# Patient Record
Sex: Male | Born: 1940 | Race: White | Hispanic: No | Marital: Married | Smoking: Former smoker
Health system: Southern US, Community
[De-identification: ages and names within clinical notes are randomized; demographics above are authoritative.]

## PROBLEM LIST (undated history)

## (undated) DIAGNOSIS — M8580 Other specified disorders of bone density and structure, unspecified site: Secondary | ICD-10-CM

## (undated) DIAGNOSIS — K5790 Diverticulosis of intestine, part unspecified, without perforation or abscess without bleeding: Secondary | ICD-10-CM

## (undated) DIAGNOSIS — C801 Malignant (primary) neoplasm, unspecified: Secondary | ICD-10-CM

## (undated) DIAGNOSIS — M1990 Unspecified osteoarthritis, unspecified site: Secondary | ICD-10-CM

## (undated) DIAGNOSIS — K635 Polyp of colon: Secondary | ICD-10-CM

## (undated) DIAGNOSIS — T7840XA Allergy, unspecified, initial encounter: Secondary | ICD-10-CM

## (undated) DIAGNOSIS — E785 Hyperlipidemia, unspecified: Secondary | ICD-10-CM

## (undated) HISTORY — PX: KNEE SURGERY: SHX244

## (undated) HISTORY — DX: Diverticulosis of intestine, part unspecified, without perforation or abscess without bleeding: K57.90

## (undated) HISTORY — DX: Other specified disorders of bone density and structure, unspecified site: M85.80

## (undated) HISTORY — DX: Hyperlipidemia, unspecified: E78.5

## (undated) HISTORY — DX: Unspecified osteoarthritis, unspecified site: M19.90

## (undated) HISTORY — DX: Polyp of colon: K63.5

## (undated) HISTORY — PX: COLONOSCOPY: SHX174

## (undated) HISTORY — PX: TONSILLECTOMY: SUR1361

## (undated) HISTORY — DX: Malignant (primary) neoplasm, unspecified: C80.1

## (undated) HISTORY — DX: Allergy, unspecified, initial encounter: T78.40XA

---

## 2006-08-03 ENCOUNTER — Ambulatory Visit: Payer: Self-pay | Admitting: Internal Medicine

## 2006-08-15 ENCOUNTER — Ambulatory Visit: Payer: Self-pay | Admitting: Internal Medicine

## 2006-08-15 ENCOUNTER — Encounter: Payer: Self-pay | Admitting: Internal Medicine

## 2010-09-02 ENCOUNTER — Encounter: Payer: Self-pay | Admitting: Genetic Counselor

## 2010-09-21 ENCOUNTER — Encounter: Payer: Medicare Other | Admitting: Genetic Counselor

## 2011-07-09 ENCOUNTER — Encounter: Payer: Self-pay | Admitting: Internal Medicine

## 2011-08-04 ENCOUNTER — Encounter: Payer: Self-pay | Admitting: Internal Medicine

## 2011-09-06 ENCOUNTER — Ambulatory Visit (INDEPENDENT_AMBULATORY_CARE_PROVIDER_SITE_OTHER): Payer: Medicare Other | Admitting: Internal Medicine

## 2011-09-06 ENCOUNTER — Encounter: Payer: Self-pay | Admitting: Internal Medicine

## 2011-09-06 VITALS — BP 148/84 | HR 72 | Ht 70.0 in | Wt 196.6 lb

## 2011-09-06 DIAGNOSIS — R198 Other specified symptoms and signs involving the digestive system and abdomen: Secondary | ICD-10-CM

## 2011-09-06 DIAGNOSIS — K648 Other hemorrhoids: Secondary | ICD-10-CM

## 2011-09-06 DIAGNOSIS — Z86010 Personal history of colonic polyps: Secondary | ICD-10-CM

## 2011-09-06 MED ORDER — MOVIPREP 100 G PO SOLR: 1.0000 | Freq: Once | ORAL | Status: DC

## 2011-09-06 MED ORDER — HYDROCORTISONE 2.5 % RE CREA: TOPICAL_CREAM | Freq: Every evening | RECTAL | Status: AC | PRN

## 2011-09-06 NOTE — Progress Notes (Signed)
HISTORY OF PRESENT ILLNESS:  Cory Barker is a 71 y.o. male with the below listed medical history who presents today regarding symptomatic hemorrhoids and surveillance colonoscopy. The patient underwent routine screening colonoscopy in June of 2008. At that time there were complaints of "pelvic pain and rectal prolapse". Examination revealed left-sided diverticulosis, internal hemorrhoids, and diminutive colon polyps which were removed and found to be adenomatous. Followup in 5 years recommended. Patient tells me that he has changed his diet. He more fruits and vegetables. He has noticed that his bowel habits intended to be a bit more loose, except for when consuming cheese. He reports intermittent problems with prolapsing hemorrhoids. Occasional fecal soilage. Possibly minor bleeding. He states the symptoms have been present for 10-11 years and began after a physician performed a standard digital rectal examination and had difficulty removing his finger (according to the patient). In any event, outside records from his physician reviewed. Recent rectal examination revealed no abnormalities. I have also reviewed outside blood work which was unremarkable and included urinalysis and comprehensive metabolic panel. Also, unremarkable EKG. Patient denies abdominal pain or weight loss. No interval family history of colon cancer.  REVIEW OF SYSTEMS:  All non-GI ROS negative except for sinus and allergy trouble, arthritis  Past Medical History  Diagnosis Date  . Diverticulosis   . Colon polyps   . Hemorrhoids   . Arthritis   . Osteopenia   . Hyperlipidemia     Past Surgical History  Procedure Date  . Knee surgery     bilateral  . Colonoscopy   . Tonsillectomy     Social History Cory Barker  reports that he quit smoking about 28 years ago. He has never used smokeless tobacco. He reports that he drinks alcohol. He reports that he does not use illicit drugs.  family history includes Lung cancer (age  of onset:53) in his father and Stroke (age of onset:89) in his mother.  Allergies  Allergen Reactions  . Penicillins Rash       PHYSICAL EXAMINATION: Vital signs: BP 148/84  Pulse 72  Ht 5\' 10"  (1.778 m)  Wt 196 lb 9.6 oz (89.177 kg)  BMI 28.21 kg/m2  Constitutional: generally well-appearing, no acute distress Psychiatric: alert and oriented x3, cooperative Eyes: extraocular movements intact, anicteric, conjunctiva pink Mouth: oral pharynx moist, no lesions Neck: supple no lymphadenopathy Cardiovascular: heart regular rate and rhythm, no murmur Lungs: clear to auscultation bilaterally Abdomen: soft, nontender, nondistended, no obvious ascites, no peritoneal signs, normal bowel sounds, no organomegaly Rectal:no external abnormalities. Palpable internal hemorrhoids. No pain. Hemoccult negative. Extremities: no lower extremity edema bilaterally Skin: no lesions on visible extremities Neuro: No focal deficits. No asterixis.     ASSESSMENT:  #1. Internal hemorrhoids #2. Nonspecific change in bowel habits #3. Personal history of adenomatous polyps. Due for followup #4. History of diverticulosis   PLAN:  #1. Fiber supplementation with Metamucil to assist with change in bowel habits and hemorrhoids #2. Prescribe Anusol suppositories to be taken at night as needed #3. Discussion today on the pathophysiology and treatment of hemorrhoid disease. 15 minutes. #4. Schedule surveillance colonoscopy.The nature of the procedure, as well as the risks, benefits, and alternatives were carefully and thoroughly reviewed with the patient. Ample time for discussion and questions allowed. The patient understood, was satisfied, and agreed to proceed.  #5. Movi prep prescribed. The patient instructed on its use

## 2011-09-06 NOTE — Patient Instructions (Addendum)
You have been scheduled for a colonoscopy with propofol. Please follow written instructions given to you at your visit today.  Please pick up your prep kit at the pharmacy within the next 1-3 days.  We have sent the following medications to your pharmacy for you to pick up at your convenience: Anusol

## 2011-09-29 ENCOUNTER — Encounter: Payer: Self-pay | Admitting: Internal Medicine

## 2011-09-29 ENCOUNTER — Ambulatory Visit (AMBULATORY_SURGERY_CENTER): Payer: Medicare Other | Admitting: Internal Medicine

## 2011-09-29 VITALS — BP 147/72 | HR 55 | Temp 96.3°F | Resp 20 | Ht 70.0 in | Wt 196.0 lb

## 2011-09-29 DIAGNOSIS — R198 Other specified symptoms and signs involving the digestive system and abdomen: Secondary | ICD-10-CM

## 2011-09-29 DIAGNOSIS — D126 Benign neoplasm of colon, unspecified: Secondary | ICD-10-CM

## 2011-09-29 DIAGNOSIS — K648 Other hemorrhoids: Secondary | ICD-10-CM

## 2011-09-29 DIAGNOSIS — Z86010 Personal history of colonic polyps: Secondary | ICD-10-CM

## 2011-09-29 DIAGNOSIS — Z1211 Encounter for screening for malignant neoplasm of colon: Secondary | ICD-10-CM

## 2011-09-29 MED ORDER — SODIUM CHLORIDE 0.9 % IV SOLN: 500.0000 mL | INTRAVENOUS | Status: DC

## 2011-09-29 NOTE — Patient Instructions (Addendum)
YOU HAD AN ENDOSCOPIC PROCEDURE TODAY AT THE [facility]: Refer to the procedure report that was given to you for any specific questions about what was found during the examination.  If the procedure report does not answer your questions, please call your gastroenterologist to clarify.  If you requested that your care partner not be given the details of your procedure findings, then the procedure report has been included in a sealed envelope for you to review at your convenience later.  YOU SHOULD EXPECT: Some feelings of bloating in the abdomen. Passage of more gas than usual.  Walking can help get rid of the air that was put into your GI tract during the procedure and reduce the bloating. If you had a lower endoscopy (such as a colonoscopy or flexible sigmoidoscopy) you may notice spotting of blood in your stool or on the toilet paper. If you underwent a bowel prep for your procedure, then you may not have a normal bowel movement for a few days.  DIET: Your first meal following the procedure should be a light meal and then it is ok to progress to your normal diet.  A half-sandwich or bowl of soup is an example of a good first meal.  Heavy or fried foods are harder to digest and may make you feel nauseous or bloated.  Likewise meals heavy in dairy and vegetables can cause extra gas to form and this can also increase the bloating.  Drink plenty of fluids but you should avoid alcoholic beverages for 24 hours.  ACTIVITY: Your care partner should take you home directly after the procedure.  You should plan to take it easy, moving slowly for the rest of the day.  You can resume normal activity the day after the procedure however you should NOT DRIVE or use heavy machinery for 24 hours (because of the sedation medicines used during the test).    SYMPTOMS TO REPORT IMMEDIATELY: A gastroenterologist can be reached at any hour.  During normal business hours, 8:30 AM to 5:00 PM Monday through Friday,  call [phone].  After hours and on weekends, please call the GI answering service at [phone] who will take a message and have the physician on call contact you.   Following lower endoscopy (colonoscopy or flexible sigmoidoscopy):  Excessive amounts of blood in the stool  Significant tenderness or worsening of abdominal pains  Swelling of the abdomen that is new, acute  Fever of 100F or higher  FOLLOW UP: If any biopsies were taken you will be contacted by phone or by letter within the next 1-3 weeks.  Call your gastroenterologist if you have not heard about the biopsies in 3 weeks.  Our staff will call the home number listed on your records the next business day following your procedure to check on you and address any questions or concerns that you may have at that time regarding the information given to you following your procedure. This is a courtesy call and so if there is no answer at the home number and we have not heard from you through the emergency physician on call, we will assume that you have returned to your regular daily activities without incident.  SIGNATURES/CONFIDENTIALITY: You and/or your care partner have signed paperwork which will be entered into your electronic medical record.  These signatures attest to the fact that that the information above on your After Visit Summary has been reviewed and is understood.  Full responsibility of the confidentiality of this   discharge information lies with you and/or your care-partner.   Ok to resume your normal medications 

## 2011-09-29 NOTE — Progress Notes (Signed)
Propofol per k rogers crna. All medicines titrated per crna throughout the entire procedure. See scanned intra procedure report. ewm

## 2011-09-29 NOTE — Op Note (Signed)
[facility] [address] [place], [place]  [address]  COLONOSCOPY PROCEDURE REPORT  PATIENT:  Cory Barker, Cory Barker  MR#:  [id number] BIRTHDATE:  1940-09-14, 71 yrs. old  GENDER:  male ENDOSCOPIST:  Wilhemina Bonito. Eda Keys, MD REF. BY:  Surveillance Program Recall, PROCEDURE DATE:  09/29/2011 PROCEDURE:  Colonoscopy with snare polypectomy x 4 ASA CLASS:  Class II INDICATIONS:  history of pre-cancerous (adenomatous) colon polyps, surveillance and high-risk screening ; INDEX EXAM 08-2006 W/ TAs MEDICATIONS:   MAC sedation, administered by CRNA, propofol (Diprivan) 280 mg IV  DESCRIPTION OF PROCEDURE:   After the risks benefits and alternatives of the procedure were thoroughly explained, informed consent was obtained.  Digital rectal exam was performed and revealed no abnormalities.   The LB [id number] endoscope was introduced through the anus and advanced to the cecum, which was identified by both the appendix and ileocecal valve, without limitations.  The quality of the prep was excellent, using MoviPrep.  The instrument was then slowly withdrawn as the colon was fully examined. <<PROCEDUREIMAGES>>  FINDINGS:  Four polyps, measuring 4-22mm, were found in the ascending (2) and transverse (2) colon. Polyps were snared without cautery. Retrieval was successful.  Moderate diverticulosis was found in the left colon. Retroflexed views in the rectum revealed internal hemorrhoids. The time to cecum = 4:19  minutes. The scope was then withdrawn in 12:34 minutes from the cecum and the procedure completed.  COMPLICATIONS:  None  ENDOSCOPIC IMPRESSION: 1) Four polyps in the  colon - removed 2) Moderate diverticulosis in the left colon 3) Internal hemorrhoids  RECOMMENDATIONS: 1) Repeat Colonoscopy in 3 years.  ______________________________ Wilhemina Bonito. Eda Keys, MD  CC:  Herb Grays, MD; The Patient  n. eSIGNED:   Wilhemina Bonito. Eda Keys at 09/29/2011 04:54 PM  Cory Barker, Cory Maduro, Cory Barker

## 2011-09-29 NOTE — Progress Notes (Addendum)
Abdomen distended and not able to pass air.  Levsin 0.169m SL 2 tablets given.  Knees to chest and told to bear down and try to pass air.  Pt was turned to right and left side; he was able to pass air.  Abdomen still distended, so helped up to the BR to try to pass air  Large amt or air passed and abdomen soft  Patient did not experience any of the following events: a burn prior to discharge; a fall within the facility; wrong site/side/patient/procedure/implant event; or a hospital transfer or hospital admission upon discharge from the facility. [phone]) Patient did not have preoperative order for IV antibiotic SSI prophylaxis. [phone])

## 2011-09-30 ENCOUNTER — Telehealth: Payer: Self-pay

## 2011-09-30 NOTE — Telephone Encounter (Signed)
  Follow up Call-  Call back number 09/29/2011  Post procedure Call Back phone  # [phone]  Permission to leave phone message Yes     Patient questions:  Do you have a fever, pain , or abdominal swelling? no Pain Score  0 *  Have you tolerated food without any problems? yes  Have you been able to return to your normal activities? yes  Do you have any questions about your discharge instructions: Diet   no Medications  no Follow up visit  no  Do you have questions or concerns about your Care? no  Actions: * If pain score is 4 or above: No action needed, pain <4.

## 2011-10-06 ENCOUNTER — Encounter: Payer: Self-pay | Admitting: Internal Medicine

## 2013-08-13 ENCOUNTER — Other Ambulatory Visit: Payer: Self-pay | Admitting: Orthopedic Surgery

## 2013-08-13 DIAGNOSIS — M47812 Spondylosis without myelopathy or radiculopathy, cervical region: Secondary | ICD-10-CM

## 2013-08-14 ENCOUNTER — Other Ambulatory Visit: Payer: Medicare Other

## 2013-08-15 ENCOUNTER — Other Ambulatory Visit: Payer: Self-pay | Admitting: Orthopedic Surgery

## 2013-08-15 ENCOUNTER — Ambulatory Visit
Admission: RE | Admit: 2013-08-15 | Discharge: 2013-08-15 | Disposition: A | Discharge status: Home / Self Care | Payer: Medicare Other | Source: Ambulatory Visit | Attending: Orthopedic Surgery | Admitting: Orthopedic Surgery

## 2013-08-15 DIAGNOSIS — M47812 Spondylosis without myelopathy or radiculopathy, cervical region: Secondary | ICD-10-CM

## 2013-08-15 DIAGNOSIS — Z77018 Contact with and (suspected) exposure to other hazardous metals: Secondary | ICD-10-CM

## 2014-04-03 DIAGNOSIS — L814 Other melanin hyperpigmentation: Secondary | ICD-10-CM | POA: Diagnosis not present

## 2014-04-03 DIAGNOSIS — D2339 Other benign neoplasm of skin of other parts of face: Secondary | ICD-10-CM | POA: Diagnosis not present

## 2014-04-03 DIAGNOSIS — Z85828 Personal history of other malignant neoplasm of skin: Secondary | ICD-10-CM | POA: Diagnosis not present

## 2014-04-03 DIAGNOSIS — D2239 Melanocytic nevi of other parts of face: Secondary | ICD-10-CM | POA: Diagnosis not present

## 2014-04-03 DIAGNOSIS — L821 Other seborrheic keratosis: Secondary | ICD-10-CM | POA: Diagnosis not present

## 2014-04-03 DIAGNOSIS — Z08 Encounter for follow-up examination after completed treatment for malignant neoplasm: Secondary | ICD-10-CM | POA: Diagnosis not present

## 2014-04-03 DIAGNOSIS — L570 Actinic keratosis: Secondary | ICD-10-CM | POA: Diagnosis not present

## 2014-04-03 DIAGNOSIS — D225 Melanocytic nevi of trunk: Secondary | ICD-10-CM | POA: Diagnosis not present

## 2014-05-01 DIAGNOSIS — Z09 Encounter for follow-up examination after completed treatment for conditions other than malignant neoplasm: Secondary | ICD-10-CM | POA: Diagnosis not present

## 2014-05-01 DIAGNOSIS — Z872 Personal history of diseases of the skin and subcutaneous tissue: Secondary | ICD-10-CM | POA: Diagnosis not present

## 2014-07-19 DIAGNOSIS — M542 Cervicalgia: Secondary | ICD-10-CM | POA: Diagnosis not present

## 2014-07-19 DIAGNOSIS — M5032 Other cervical disc degeneration, mid-cervical region: Secondary | ICD-10-CM | POA: Diagnosis not present

## 2014-07-19 DIAGNOSIS — M5412 Radiculopathy, cervical region: Secondary | ICD-10-CM | POA: Diagnosis not present

## 2014-07-25 ENCOUNTER — Encounter: Payer: Self-pay | Admitting: Internal Medicine

## 2014-08-15 ENCOUNTER — Encounter: Payer: Self-pay | Admitting: Internal Medicine

## 2014-08-15 DIAGNOSIS — Z85828 Personal history of other malignant neoplasm of skin: Secondary | ICD-10-CM | POA: Diagnosis not present

## 2014-08-15 DIAGNOSIS — L648 Other androgenic alopecia: Secondary | ICD-10-CM | POA: Diagnosis not present

## 2014-08-15 DIAGNOSIS — L821 Other seborrheic keratosis: Secondary | ICD-10-CM | POA: Diagnosis not present

## 2014-08-15 DIAGNOSIS — D1801 Hemangioma of skin and subcutaneous tissue: Secondary | ICD-10-CM | POA: Diagnosis not present

## 2014-08-15 DIAGNOSIS — L814 Other melanin hyperpigmentation: Secondary | ICD-10-CM | POA: Diagnosis not present

## 2014-08-28 DIAGNOSIS — R5382 Chronic fatigue, unspecified: Secondary | ICD-10-CM | POA: Diagnosis not present

## 2014-08-28 DIAGNOSIS — Z131 Encounter for screening for diabetes mellitus: Secondary | ICD-10-CM | POA: Diagnosis not present

## 2014-08-28 DIAGNOSIS — E785 Hyperlipidemia, unspecified: Secondary | ICD-10-CM | POA: Diagnosis not present

## 2014-08-28 DIAGNOSIS — Z0000 Encounter for general adult medical examination without abnormal findings: Secondary | ICD-10-CM | POA: Diagnosis not present

## 2014-10-25 ENCOUNTER — Encounter: Payer: Self-pay | Admitting: Internal Medicine

## 2014-12-03 ENCOUNTER — Ambulatory Visit (AMBULATORY_SURGERY_CENTER): Payer: Self-pay

## 2014-12-03 VITALS — Ht 70.0 in | Wt 216.0 lb

## 2014-12-03 DIAGNOSIS — Z86010 Personal history of colonic polyps: Secondary | ICD-10-CM

## 2014-12-03 NOTE — Progress Notes (Signed)
No egg or soy allergy.  No previous complications from anesthesia. No home O2. No diet meds. 

## 2014-12-17 ENCOUNTER — Ambulatory Visit (AMBULATORY_SURGERY_CENTER): Payer: Medicare Other | Admitting: Internal Medicine

## 2014-12-17 ENCOUNTER — Encounter: Payer: Self-pay | Admitting: Internal Medicine

## 2014-12-17 VITALS — BP 154/96 | HR 67 | Temp 98.4°F | Resp 19 | Ht 70.0 in | Wt 216.0 lb

## 2014-12-17 DIAGNOSIS — K649 Unspecified hemorrhoids: Secondary | ICD-10-CM | POA: Diagnosis not present

## 2014-12-17 DIAGNOSIS — Z86010 Personal history of colonic polyps: Secondary | ICD-10-CM

## 2014-12-17 MED ORDER — SODIUM CHLORIDE 0.9 % IV SOLN: 500.0000 mL | INTRAVENOUS | Status: DC

## 2014-12-17 MED ORDER — HYDROCORTISONE ACETATE 25 MG RE SUPP: RECTAL | Status: DC

## 2014-12-17 NOTE — Progress Notes (Signed)
Transferred to recovery room. A/O x3, pleased with MAC.  VSS.  Report to Jane, RN. 

## 2014-12-17 NOTE — Patient Instructions (Signed)
YOU HAD AN ENDOSCOPIC PROCEDURE TODAY AT [facility]:   Refer to the procedure report that was given to you for any specific questions about what was found during the examination.  If the procedure report does not answer your questions, please call your gastroenterologist to clarify.  If you requested that your care partner not be given the details of your procedure findings, then the procedure report has been included in a sealed envelope for you to review at your convenience later.  YOU SHOULD EXPECT: Some feelings of bloating in the abdomen. Passage of more gas than usual.  Walking can help get rid of the air that was put into your GI tract during the procedure and reduce the bloating. If you had a lower endoscopy (such as a colonoscopy or flexible sigmoidoscopy) you may notice spotting of blood in your stool or on the toilet paper. If you underwent a bowel prep for your procedure, you may not have a normal bowel movement for a few days.  Please Note:  You might notice some irritation and congestion in your nose or some drainage.  This is from the oxygen used during your procedure.  There is no need for concern and it should clear up in a day or so.  SYMPTOMS TO REPORT IMMEDIATELY:   Following lower endoscopy (colonoscopy or flexible sigmoidoscopy):  Excessive amounts of blood in the stool  Significant tenderness or worsening of abdominal pains  Swelling of the abdomen that is new, acute  Fever of 100F or higher    For urgent or emergent issues, a gastroenterologist can be reached at any hour by calling [phone].   DIET: Your first meal following the procedure should be a small meal and then it is ok to progress to your normal diet. Heavy or fried foods are harder to digest and may make you feel nauseous or bloated.  Likewise, meals heavy in dairy and vegetables can increase bloating.  Drink plenty of fluids but you should avoid alcoholic beverages for 24  hours.  ACTIVITY:  You should plan to take it easy for the rest of today and you should NOT DRIVE or use heavy machinery until tomorrow (because of the sedation medicines used during the test).    FOLLOW UP: Our staff will call the number listed on your records the next business day following your procedure to check on you and address any questions or concerns that you may have regarding the information given to you following your procedure. If we do not reach you, we will leave a message.  However, if you are feeling well and you are not experiencing any problems, there is no need to return our call.  We will assume that you have returned to your regular daily activities without incident.  If any biopsies were taken you will be contacted by phone or by letter within the next 1-3 weeks.  Please call us at [phone] if you have not heard about the biopsies in 3 weeks.    SIGNATURES/CONFIDENTIALITY: You and/or your care partner have signed paperwork which will be entered into your electronic medical record.  These signatures attest to the fact that that the information above on your After Visit Summary has been reviewed and is understood.  Full responsibility of the confidentiality of this discharge information lies with you and/or your care-partner.  Diverticulosis, high fiber diet, and hemorrhoid information given.  Continue daily fiber supplementation.    OK to use miralax if constipated-take as  directed.

## 2014-12-17 NOTE — Op Note (Signed)
[facility]  [address] [place], [id number]   COLONOSCOPY PROCEDURE REPORT  PATIENT: Cory, Barker  MR#: [id number] BIRTHDATE: Jun 18, 1940 , 74  yrs. old GENDER: male ENDOSCOPIST: Eustace Quail, MD REFERRED OI:NOMVEHMCNOBS Program Recall PROCEDURE DATE:  12/17/2014 PROCEDURE:   Colonoscopy, surveillance First Screening Colonoscopy - Avg.  risk and is 50 yrs.  old or older - No.  Prior Negative Screening - Now for repeat screening. N/A  History of Adenoma - Now for follow-up colonoscopy & has been > or = to 3 yrs.  Yes hx of adenoma.  Has been 3 or more years since last colonoscopy.  Polyps removed today? No Recommend repeat exam, <10 yrs? Yes high risk ASA CLASS:   Class II INDICATIONS:Surveillance due to prior colonic neoplasia and PH Colon Adenoma.. Index examination 2008 with tubular adenoma. Follow-up examination 2013 with 4 diminutive adenomas. MEDICATIONS: Monitored anesthesia care and Propofol 200 mg IV  DESCRIPTION OF PROCEDURE:   After the risks benefits and alternatives of the procedure were thoroughly explained, informed consent was obtained.  The digital rectal exam revealed no abnormalities of the rectum.   The LB [id number]  endoscope was introduced through the anus and advanced to the cecum, which was identified by both the appendix and ileocecal valve. No adverse events experienced.   The quality of the prep was good.  (MiraLax was used)  The instrument was then slowly withdrawn as the colon was fully examined. Estimated blood loss is zero unless otherwise noted in this procedure report.  COLON FINDINGS: There was severe diverticulosis noted in the left colon.   The examination was otherwise normal.  Retroflexed views revealed internal hemorrhoids. The time to cecum = 2.0 Withdrawal time = 12.5   The scope was withdrawn and the procedure completed. COMPLICATIONS: There were no immediate complications.  ENDOSCOPIC IMPRESSION: 1.    Severe diverticulosis was noted in the left colon 2.   The examination was otherwise normal  RECOMMENDATIONS: 1.  Follow up colonoscopy in 5 years 2.  Continue daily fiber supplementation 3. Okay to use MiraLAX if constipated. Take as directed 4. Prescribe Anusol HC medicated suppository; #30; 1 per rectum at night as needed for hemorrhoids; 6 refills  eSigned:  Eustace Quail, MD 12/17/2014 3:42 PM   cc: The Patient

## 2014-12-17 NOTE — Progress Notes (Signed)
Pt. Noted to have distended abdomen.  Passes small amount of gas.  No pain, just fullness.  Dr. Henrene Pastor examined.  Walked around nurses station and to bathroom. Discharged to home, abdomen remains distended.  Pt. Advised to walk at home. And do clear liquids until some of air is passed.

## 2014-12-18 ENCOUNTER — Telehealth: Payer: Self-pay | Admitting: *Deleted

## 2014-12-18 NOTE — Telephone Encounter (Signed)
  Follow up Call-  Call back number 12/17/2014  Post procedure Call Back phone  # [phone]  Permission to leave phone message Yes     Patient questions:  Do you have a fever, pain , or abdominal swelling? No. Pain Score  0 *  Have you tolerated food without any problems? Yes.    Have you been able to return to your normal activities? Yes.    Do you have any questions about your discharge instructions: Diet   No. Medications  No. Follow up visit  No.  Do you have questions or concerns about your Care? No.  Actions: * If pain score is 4 or above: No action needed, pain <4.

## 2015-01-10 DIAGNOSIS — M4722 Other spondylosis with radiculopathy, cervical region: Secondary | ICD-10-CM | POA: Diagnosis not present

## 2015-01-10 DIAGNOSIS — M542 Cervicalgia: Secondary | ICD-10-CM | POA: Diagnosis not present

## 2015-01-10 DIAGNOSIS — M5412 Radiculopathy, cervical region: Secondary | ICD-10-CM | POA: Diagnosis not present

## 2015-03-20 DIAGNOSIS — L720 Epidermal cyst: Secondary | ICD-10-CM | POA: Diagnosis not present

## 2015-03-20 DIAGNOSIS — L918 Other hypertrophic disorders of the skin: Secondary | ICD-10-CM | POA: Diagnosis not present

## 2015-03-20 DIAGNOSIS — D2339 Other benign neoplasm of skin of other parts of face: Secondary | ICD-10-CM | POA: Diagnosis not present

## 2015-03-20 DIAGNOSIS — L570 Actinic keratosis: Secondary | ICD-10-CM | POA: Diagnosis not present

## 2015-03-20 DIAGNOSIS — L821 Other seborrheic keratosis: Secondary | ICD-10-CM | POA: Diagnosis not present

## 2015-03-20 DIAGNOSIS — Z08 Encounter for follow-up examination after completed treatment for malignant neoplasm: Secondary | ICD-10-CM | POA: Diagnosis not present

## 2015-03-20 DIAGNOSIS — L814 Other melanin hyperpigmentation: Secondary | ICD-10-CM | POA: Diagnosis not present

## 2015-03-20 DIAGNOSIS — D1801 Hemangioma of skin and subcutaneous tissue: Secondary | ICD-10-CM | POA: Diagnosis not present

## 2015-03-20 DIAGNOSIS — Z85828 Personal history of other malignant neoplasm of skin: Secondary | ICD-10-CM | POA: Diagnosis not present

## 2015-08-20 DIAGNOSIS — L720 Epidermal cyst: Secondary | ICD-10-CM | POA: Diagnosis not present

## 2015-08-20 DIAGNOSIS — L853 Xerosis cutis: Secondary | ICD-10-CM | POA: Diagnosis not present

## 2015-08-20 DIAGNOSIS — D2239 Melanocytic nevi of other parts of face: Secondary | ICD-10-CM | POA: Diagnosis not present

## 2015-08-20 DIAGNOSIS — Z85828 Personal history of other malignant neoplasm of skin: Secondary | ICD-10-CM | POA: Diagnosis not present

## 2015-08-20 DIAGNOSIS — D1801 Hemangioma of skin and subcutaneous tissue: Secondary | ICD-10-CM | POA: Diagnosis not present

## 2015-08-20 DIAGNOSIS — L570 Actinic keratosis: Secondary | ICD-10-CM | POA: Diagnosis not present

## 2015-08-20 DIAGNOSIS — L821 Other seborrheic keratosis: Secondary | ICD-10-CM | POA: Diagnosis not present

## 2015-08-20 DIAGNOSIS — D225 Melanocytic nevi of trunk: Secondary | ICD-10-CM | POA: Diagnosis not present

## 2015-08-20 DIAGNOSIS — D485 Neoplasm of uncertain behavior of skin: Secondary | ICD-10-CM | POA: Diagnosis not present

## 2016-03-18 DIAGNOSIS — Z85828 Personal history of other malignant neoplasm of skin: Secondary | ICD-10-CM | POA: Diagnosis not present

## 2016-03-18 DIAGNOSIS — L820 Inflamed seborrheic keratosis: Secondary | ICD-10-CM | POA: Diagnosis not present

## 2016-03-18 DIAGNOSIS — L538 Other specified erythematous conditions: Secondary | ICD-10-CM | POA: Diagnosis not present

## 2016-03-18 DIAGNOSIS — L918 Other hypertrophic disorders of the skin: Secondary | ICD-10-CM | POA: Diagnosis not present

## 2016-03-18 DIAGNOSIS — L218 Other seborrheic dermatitis: Secondary | ICD-10-CM | POA: Diagnosis not present

## 2016-03-18 DIAGNOSIS — L814 Other melanin hyperpigmentation: Secondary | ICD-10-CM | POA: Diagnosis not present

## 2016-03-18 DIAGNOSIS — D1801 Hemangioma of skin and subcutaneous tissue: Secondary | ICD-10-CM | POA: Diagnosis not present

## 2016-03-18 DIAGNOSIS — D2339 Other benign neoplasm of skin of other parts of face: Secondary | ICD-10-CM | POA: Diagnosis not present

## 2016-03-18 DIAGNOSIS — L570 Actinic keratosis: Secondary | ICD-10-CM | POA: Diagnosis not present

## 2016-03-18 DIAGNOSIS — L821 Other seborrheic keratosis: Secondary | ICD-10-CM | POA: Diagnosis not present

## 2016-03-18 DIAGNOSIS — Z08 Encounter for follow-up examination after completed treatment for malignant neoplasm: Secondary | ICD-10-CM | POA: Diagnosis not present

## 2016-03-18 DIAGNOSIS — L720 Epidermal cyst: Secondary | ICD-10-CM | POA: Diagnosis not present

## 2016-05-20 DIAGNOSIS — D485 Neoplasm of uncertain behavior of skin: Secondary | ICD-10-CM | POA: Diagnosis not present

## 2016-05-20 DIAGNOSIS — L820 Inflamed seborrheic keratosis: Secondary | ICD-10-CM | POA: Diagnosis not present

## 2016-05-20 DIAGNOSIS — L218 Other seborrheic dermatitis: Secondary | ICD-10-CM | POA: Diagnosis not present

## 2016-08-16 DIAGNOSIS — L859 Epidermal thickening, unspecified: Secondary | ICD-10-CM | POA: Diagnosis not present

## 2016-08-16 DIAGNOSIS — L570 Actinic keratosis: Secondary | ICD-10-CM | POA: Diagnosis not present

## 2016-08-16 DIAGNOSIS — L821 Other seborrheic keratosis: Secondary | ICD-10-CM | POA: Diagnosis not present

## 2016-08-16 DIAGNOSIS — L720 Epidermal cyst: Secondary | ICD-10-CM | POA: Diagnosis not present

## 2016-08-16 DIAGNOSIS — Z85828 Personal history of other malignant neoplasm of skin: Secondary | ICD-10-CM | POA: Diagnosis not present

## 2016-08-16 DIAGNOSIS — L218 Other seborrheic dermatitis: Secondary | ICD-10-CM | POA: Diagnosis not present

## 2016-08-16 DIAGNOSIS — D1801 Hemangioma of skin and subcutaneous tissue: Secondary | ICD-10-CM | POA: Diagnosis not present

## 2016-08-16 DIAGNOSIS — D2339 Other benign neoplasm of skin of other parts of face: Secondary | ICD-10-CM | POA: Diagnosis not present

## 2016-08-16 DIAGNOSIS — L814 Other melanin hyperpigmentation: Secondary | ICD-10-CM | POA: Diagnosis not present

## 2016-08-16 DIAGNOSIS — T07XXXA Unspecified multiple injuries, initial encounter: Secondary | ICD-10-CM | POA: Diagnosis not present

## 2016-09-10 DIAGNOSIS — M4722 Other spondylosis with radiculopathy, cervical region: Secondary | ICD-10-CM | POA: Diagnosis not present

## 2016-09-10 DIAGNOSIS — M542 Cervicalgia: Secondary | ICD-10-CM | POA: Diagnosis not present

## 2016-09-10 DIAGNOSIS — M5412 Radiculopathy, cervical region: Secondary | ICD-10-CM | POA: Diagnosis not present

## 2017-03-17 DIAGNOSIS — L918 Other hypertrophic disorders of the skin: Secondary | ICD-10-CM | POA: Diagnosis not present

## 2017-03-17 DIAGNOSIS — L570 Actinic keratosis: Secondary | ICD-10-CM | POA: Diagnosis not present

## 2017-03-17 DIAGNOSIS — D2339 Other benign neoplasm of skin of other parts of face: Secondary | ICD-10-CM | POA: Diagnosis not present

## 2017-03-17 DIAGNOSIS — L853 Xerosis cutis: Secondary | ICD-10-CM | POA: Diagnosis not present

## 2017-03-17 DIAGNOSIS — L814 Other melanin hyperpigmentation: Secondary | ICD-10-CM | POA: Diagnosis not present

## 2017-03-17 DIAGNOSIS — L304 Erythema intertrigo: Secondary | ICD-10-CM | POA: Diagnosis not present

## 2017-03-17 DIAGNOSIS — L821 Other seborrheic keratosis: Secondary | ICD-10-CM | POA: Diagnosis not present

## 2017-03-17 DIAGNOSIS — Z85828 Personal history of other malignant neoplasm of skin: Secondary | ICD-10-CM | POA: Diagnosis not present

## 2017-03-17 DIAGNOSIS — L218 Other seborrheic dermatitis: Secondary | ICD-10-CM | POA: Diagnosis not present

## 2017-03-17 DIAGNOSIS — S90122A Contusion of left lesser toe(s) without damage to nail, initial encounter: Secondary | ICD-10-CM | POA: Diagnosis not present

## 2017-03-17 DIAGNOSIS — L720 Epidermal cyst: Secondary | ICD-10-CM | POA: Diagnosis not present

## 2017-03-17 DIAGNOSIS — Z08 Encounter for follow-up examination after completed treatment for malignant neoplasm: Secondary | ICD-10-CM | POA: Diagnosis not present

## 2017-05-12 DIAGNOSIS — J029 Acute pharyngitis, unspecified: Secondary | ICD-10-CM | POA: Diagnosis not present

## 2017-07-13 ENCOUNTER — Ambulatory Visit: Payer: Medicare Other | Admitting: Family Medicine

## 2017-07-22 ENCOUNTER — Other Ambulatory Visit: Payer: Self-pay

## 2017-07-22 ENCOUNTER — Encounter: Payer: Self-pay | Admitting: Family Medicine

## 2017-07-22 ENCOUNTER — Ambulatory Visit (INDEPENDENT_AMBULATORY_CARE_PROVIDER_SITE_OTHER): Payer: Medicare Other | Admitting: Family Medicine

## 2017-07-22 VITALS — BP 130/82 | HR 52 | Temp 98.0°F | Resp 16 | Ht 70.0 in | Wt 203.1 lb

## 2017-07-22 DIAGNOSIS — M79671 Pain in right foot: Secondary | ICD-10-CM | POA: Diagnosis not present

## 2017-07-22 DIAGNOSIS — Z125 Encounter for screening for malignant neoplasm of prostate: Secondary | ICD-10-CM

## 2017-07-22 DIAGNOSIS — M810 Age-related osteoporosis without current pathological fracture: Secondary | ICD-10-CM

## 2017-07-22 DIAGNOSIS — E785 Hyperlipidemia, unspecified: Secondary | ICD-10-CM | POA: Diagnosis not present

## 2017-07-22 DIAGNOSIS — E663 Overweight: Secondary | ICD-10-CM

## 2017-07-22 DIAGNOSIS — M25512 Pain in left shoulder: Secondary | ICD-10-CM

## 2017-07-22 DIAGNOSIS — G8929 Other chronic pain: Secondary | ICD-10-CM | POA: Diagnosis not present

## 2017-07-22 LAB — HEPATIC FUNCTION PANEL
ALBUMIN: 4.4 g/dL (ref 3.5–5.2)
ALK PHOS: 54 U/L (ref 39–117)
ALT: 19 U/L (ref 0–53)
AST: 21 U/L (ref 0–37)
Bilirubin, Direct: 0.1 mg/dL (ref 0.0–0.3)
TOTAL PROTEIN: 7.3 g/dL (ref 6.0–8.3)
Total Bilirubin: 0.7 mg/dL (ref 0.2–1.2)

## 2017-07-22 LAB — LIPID PANEL
CHOLESTEROL: 179 mg/dL (ref 0–200)
HDL: 37.2 mg/dL — AB (ref >39.00)
LDL Cholesterol: 123 mg/dL — ABNORMAL HIGH (ref 0–99)
NonHDL: 142.28
TRIGLYCERIDES: 98 mg/dL (ref 0.0–149.0)
Total CHOL/HDL Ratio: 5
VLDL: 19.6 mg/dL (ref 0.0–40.0)

## 2017-07-22 LAB — CBC WITH DIFFERENTIAL/PLATELET
BASOS ABS: 0.1 10*3/uL (ref 0.0–0.1)
Basophils Relative: 1 % (ref 0.0–3.0)
EOS ABS: 0.2 10*3/uL (ref 0.0–0.7)
EOS PCT: 3 % (ref 0.0–5.0)
HCT: 44.8 % (ref 39.0–52.0)
HEMOGLOBIN: 15.6 g/dL (ref 13.0–17.0)
Lymphocytes Relative: 33.2 % (ref 12.0–46.0)
Lymphs Abs: 2.6 10*3/uL (ref 0.7–4.0)
MCHC: 34.7 g/dL (ref 30.0–36.0)
MCV: 89.2 fl (ref 78.0–100.0)
MONOS PCT: 9.5 % (ref 3.0–12.0)
Monocytes Absolute: 0.7 10*3/uL (ref 0.1–1.0)
Neutro Abs: 4.2 10*3/uL (ref 1.4–7.7)
Neutrophils Relative %: 53.3 % (ref 43.0–77.0)
RBC: 5.02 Mil/uL (ref 4.22–5.81)
RDW: 13.4 % (ref 11.5–15.5)
WBC: 7.9 10*3/uL (ref 4.0–10.5)

## 2017-07-22 LAB — BASIC METABOLIC PANEL
BUN: 28 mg/dL — AB (ref 6–23)
CO2: 27 mEq/L (ref 19–32)
CREATININE: 0.81 mg/dL (ref 0.40–1.50)
Calcium: 9.6 mg/dL (ref 8.4–10.5)
Chloride: 104 mEq/L (ref 96–112)
GFR: 98.39 mL/min (ref >60.00)
GLUCOSE: 95 mg/dL (ref 70–99)
POTASSIUM: 4.1 meq/L (ref 3.5–5.1)
Sodium: 139 mEq/L (ref 135–145)

## 2017-07-22 LAB — TSH: TSH: 1.29 u[IU]/mL (ref 0.35–4.50)

## 2017-07-22 LAB — PSA, MEDICARE: PSA: 1.31 ng/ml (ref 0.10–4.00)

## 2017-07-22 LAB — VITAMIN D 25 HYDROXY (VIT D DEFICIENCY, FRACTURES): VITD: 43.31 ng/mL (ref 30.00–100.00)

## 2017-07-22 NOTE — Assessment & Plan Note (Signed)
New to provider, ongoing for pt.  Has has lost 13 lbs since last visit in 2016.  Check labs to risk stratify.  Encouraged healthy diet and regular exercise.  Will follow.

## 2017-07-22 NOTE — Assessment & Plan Note (Signed)
New to provider but pt has hx of this.  Was previously on statin but preferred to work on healthy diet and regular exercise.  Check labs.  Start meds prn.

## 2017-07-22 NOTE — Assessment & Plan Note (Signed)
New to provider, pt has hx of this.  Has not had a DEXA recently.  Will order study and Vit D level and replete prn.  Pt expressed understanding and is in agreement w/ plan.

## 2017-07-22 NOTE — Patient Instructions (Signed)
Schedule your Medicare Wellness Visit w/ Maudie Mercury at your convenience Follow up with me in late October (before you leave) to recheck cholesterol We'll notify you of your lab results and make any changes if needed Continue to work on healthy diet and regular exercise- you look great! We'll call you with your podiatry and orthopedic appts We'll call you with your Bone Density appt Call with any questions or concerns Welcome!  We're glad to have you!!!

## 2017-07-22 NOTE — Progress Notes (Signed)
   Subjective:    Patient ID: Cory Barker, male    DOB: May 05, 1940, 77 y.o.   MRN: [id number]  HPI New to establish.  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  Spends 6 months in [place]  Hyperlipidemia- pt's last cholesterol panel (2016) showed LDL of 160.  Not on currently on medication.  Was briefly on medication but preferred lifestyle changes.  No CP, SOB, HAs, visual changes, edema.  Osteoporosis- currently on Ca and Vit D.  Unable to recall date of last DEXA.  Due for repeat.  Eating diet high in Calcium.  Overweight- pt is down 13 lbs since visit in 2016.  Playing golf regularly.  No other formal exercise.  L shoulder pain- pt reports he will have trouble raising arm (abduction).  Intermittent weakness of L arm.  L handed.  No known injury.  Doesn't interfere w/ golf.  Will at times cause pain during sleep.  sxs started ~6 months ago.  R foot pain- reports he is nearly unable to walk after a round of golf.  Pain is localized to ball of foot.  sxs started ~1 yr ago.  UTD on colonoscopy, due for Prevnar.  Review of Systems For ROS see HPI     Objective:   Physical Exam  Constitutional: He is oriented to person, place, and time. He appears well-developed and well-nourished. No distress.  HENT:  Head: Normocephalic and atraumatic.  Eyes: Pupils are equal, round, and reactive to light. Conjunctivae and EOM are normal.  Neck: Normal range of motion. Neck supple. No thyromegaly present.  Cardiovascular: Normal rate, regular rhythm, normal heart sounds and intact distal pulses.  No murmur heard. Pulmonary/Chest: Effort normal and breath sounds normal. No respiratory distress.  Abdominal: Soft. Bowel sounds are normal. He exhibits no distension.  Musculoskeletal: He exhibits no edema.  Pain w/ abduction Full ROM w/ forward flexion  Lymphadenopathy:    He has no cervical adenopathy.  Neurological: He is alert and oriented to person, place, and time. No cranial nerve deficit.  Skin: Skin is warm and dry.    Psychiatric: He has a normal mood and affect. His behavior is normal.  Vitals reviewed.         Assessment & Plan:  L shoulder pain- new to provider, ongoing for pt.  Given his pain w/ abduction and limited ROM, will refer to Ortho.  Pt expressed understanding and is in agreement w/ plan.   R foot pain- new to provider, ongoing for pt x1 yr.  Given his inability to walk after golf, will refer to podiatry for evaluation and tx.

## 2017-07-25 ENCOUNTER — Encounter: Payer: Self-pay | Admitting: General Practice

## 2017-07-27 ENCOUNTER — Ambulatory Visit (INDEPENDENT_AMBULATORY_CARE_PROVIDER_SITE_OTHER): Payer: Medicare Other | Admitting: Podiatry

## 2017-07-27 ENCOUNTER — Other Ambulatory Visit: Payer: Self-pay | Admitting: Podiatry

## 2017-07-27 ENCOUNTER — Encounter: Payer: Self-pay | Admitting: Podiatry

## 2017-07-27 ENCOUNTER — Ambulatory Visit (INDEPENDENT_AMBULATORY_CARE_PROVIDER_SITE_OTHER): Payer: Medicare Other

## 2017-07-27 VITALS — BP 135/85 | HR 72

## 2017-07-27 DIAGNOSIS — M79671 Pain in right foot: Secondary | ICD-10-CM

## 2017-07-27 DIAGNOSIS — M779 Enthesopathy, unspecified: Secondary | ICD-10-CM | POA: Diagnosis not present

## 2017-07-27 DIAGNOSIS — L84 Corns and callosities: Secondary | ICD-10-CM

## 2017-07-27 DIAGNOSIS — M778 Other enthesopathies, not elsewhere classified: Secondary | ICD-10-CM

## 2017-07-27 MED ORDER — TRIAMCINOLONE ACETONIDE 10 MG/ML IJ SUSP
10.0000 mg | Freq: Once | INTRAMUSCULAR | Status: AC
  Administered 2017-07-27: 10 mg

## 2017-07-27 NOTE — Progress Notes (Signed)
Subjective:   Patient ID: Cory Barker, male   DOB: 77 y.o.   MRN: [id number]   HPI Patient presents with chronic long-term pain underneath the fifth metatarsal of the right foot.  Worse when trying to play golf or do activities and states it is gradually become more of an issue for him and making walking uncomfortable patient does not smoke and likes to be active   Review of Systems  All other systems reviewed and are negative.       Objective:  Physical Exam  Constitutional: He appears well-developed and well-nourished.  Cardiovascular: Intact distal pulses.  Pulmonary/Chest: Effort normal.  Musculoskeletal: Normal range of motion.  Neurological: He is alert.  Skin: Skin is warm.  Nursing note and vitals reviewed.   Neurovascular status intact muscle strength is adequate range of motion within normal limits with patient found to have prominence around the fifth metatarsal head right with fluid buildup and keratotic lesion that is painful when pressed.  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  Patient will be seen back for [place] to recheck again and may require fifth metatarsal head resection which I made him aware of today  Patient appears to have hypertrophy around the fifth metatarsal head right

## 2017-08-02 ENCOUNTER — Encounter: Payer: Self-pay | Admitting: Family Medicine

## 2017-08-15 ENCOUNTER — Telehealth: Payer: Self-pay | Admitting: Family Medicine

## 2017-08-15 NOTE — Telephone Encounter (Signed)
Copied from [place] [id number]. Topic: Referral - Request >> Aug 11, 2017  7:58 AM Pricilla Handler wrote: Reason for CRM: Patient wants a call back from the referral coordinator. Patient does not want to go to [facility], but [facility] instead. Patient states that he wants to see Zollie Beckers or Rodell Perna at [facility]. Please call patient at [phone].         Thank You!!!  >> Aug 15, 2017 12:42 PM Cleaster Corin, NT wrote: Pt. Calling back and would like to speak with someone about a call he received earlier today regaurding referral.

## 2017-08-16 DIAGNOSIS — D1801 Hemangioma of skin and subcutaneous tissue: Secondary | ICD-10-CM | POA: Diagnosis not present

## 2017-08-16 DIAGNOSIS — D225 Melanocytic nevi of trunk: Secondary | ICD-10-CM | POA: Diagnosis not present

## 2017-08-16 DIAGNOSIS — L708 Other acne: Secondary | ICD-10-CM | POA: Diagnosis not present

## 2017-08-16 DIAGNOSIS — L570 Actinic keratosis: Secondary | ICD-10-CM | POA: Diagnosis not present

## 2017-08-16 DIAGNOSIS — L814 Other melanin hyperpigmentation: Secondary | ICD-10-CM | POA: Diagnosis not present

## 2017-08-16 DIAGNOSIS — Z85828 Personal history of other malignant neoplasm of skin: Secondary | ICD-10-CM | POA: Diagnosis not present

## 2017-08-17 ENCOUNTER — Ambulatory Visit (INDEPENDENT_AMBULATORY_CARE_PROVIDER_SITE_OTHER)
Admission: RE | Admit: 2017-08-17 | Discharge: 2017-08-17 | Disposition: A | Discharge status: Home / Self Care | Payer: Medicare Other | Source: Ambulatory Visit | Attending: Family Medicine | Admitting: Family Medicine

## 2017-08-17 ENCOUNTER — Other Ambulatory Visit (HOSPITAL_COMMUNITY): Payer: Medicare Other

## 2017-08-17 DIAGNOSIS — M810 Age-related osteoporosis without current pathological fracture: Secondary | ICD-10-CM | POA: Diagnosis not present

## 2017-08-18 ENCOUNTER — Encounter: Payer: Self-pay | Admitting: General Practice

## 2017-08-31 ENCOUNTER — Ambulatory Visit (INDEPENDENT_AMBULATORY_CARE_PROVIDER_SITE_OTHER): Payer: Medicare Other | Admitting: Orthopaedic Surgery

## 2017-08-31 ENCOUNTER — Ambulatory Visit (INDEPENDENT_AMBULATORY_CARE_PROVIDER_SITE_OTHER): Payer: Medicare Other

## 2017-08-31 ENCOUNTER — Encounter (INDEPENDENT_AMBULATORY_CARE_PROVIDER_SITE_OTHER): Payer: Self-pay | Admitting: Orthopaedic Surgery

## 2017-08-31 DIAGNOSIS — G8929 Other chronic pain: Secondary | ICD-10-CM | POA: Diagnosis not present

## 2017-08-31 DIAGNOSIS — M25512 Pain in left shoulder: Secondary | ICD-10-CM

## 2017-08-31 MED ORDER — LIDOCAINE HCL 1 % IJ SOLN
3.0000 mL | INTRAMUSCULAR | Status: AC | PRN
  Administered 2017-08-31: 3 mL

## 2017-08-31 MED ORDER — METHYLPREDNISOLONE ACETATE 40 MG/ML IJ SUSP
40.0000 mg | INTRAMUSCULAR | Status: AC | PRN
  Administered 2017-08-31: 40 mg via INTRA_ARTICULAR

## 2017-08-31 NOTE — Progress Notes (Signed)
Office Visit Note   Patient: Cory Barker           Date of Birth: 08-Jul-1940           MRN: [id number] Visit Date: 08/31/2017              Requested by: Midge Minium, MD [address] [place], [place] [address] PCP: Midge Minium, MD   Assessment & Plan: Visit Diagnoses:  1. Chronic left shoulder pain     Plan: I talked with the patient about his x-rays and explained the signs and symptoms of impingement syndrome of the shoulder.  Offered him a steroid injection which he agreed to. Risk minutes of injections and he tolerated well.  We talked about the activities to avoid with his shoulder.  All question concerns were answered and addressed.  Follow-up will be as needed.  He knows to wait at least 3 months between injections.  Follow-Up Instructions: Return if symptoms worsen or fail to improve.   Orders:  Orders Placed This Encounter  Procedures  . Large Joint Inj  . XR Shoulder Left   No orders of the defined types were placed in this encounter.     Procedures: Large Joint Inj: L subacromial bursa on 08/31/2017 9:00 AM Indications: pain and diagnostic evaluation Details: 22 G 1.5 in needle  Arthrogram: No  Medications: 3 mL lidocaine 1 %; 40 mg methylPREDNISolone acetate 40 MG/ML Outcome: tolerated well, no immediate complications Procedure, treatment alternatives, risks and benefits explained, specific risks discussed. Consent was given by the patient. Immediately prior to procedure a time out was called to verify the correct patient, procedure, equipment, support staff and site/side marked as required. Patient was prepped and draped in the usual sterile fashion.       Clinical Data: No additional findings.   Subjective: Chief Complaint  Patient presents with  . Left Shoulder - Pain  The patient is a very pleasant 77 year old left-hand dominant gentleman who has been having worsening left shoulder problems for about a year now.  Hurts with  overhead activities and reaching behind him.  He says range of motion strength are decreasing and occasionally he has a constant discomfort in that shoulder.  He denies any specific injuries but he says he is been using his iPad a lot and is with his shoulder will bother him enough with certain motions of the shoulder.  He denies any neck pain he denies any numbness tingling in his left hand.  Again he denies any specific injuries.  HPI  Review of Systems He currently denies any headache, chest pain, shortness of breath, fever, chills, nausea, vomiting.  He is not a diabetic.  Objective: Vital Signs: There were no vitals taken for this visit.  Physical Exam He is alert and oriented x3 and in no acute distress Ortho Exam Examination of his left shoulder shows his range of motion is full but he does have positive Neer and Hawkins signs.  His liftoff is negative.  His rotator cuff feels strong. Specialty Comments:  No specialty comments available.  Imaging: Xr Shoulder Left  Result Date: 08/31/2017 3 views left shoulder show well located shoulder.  There are no acute findings.  There is a subacromial bone spur underneath the clavicle at the [facility] joint this slightly decreases the subacromial outlet.    PMFS History: Patient Active Problem List   Diagnosis Date Noted  . Hyperlipidemia 07/22/2017  . Osteoporosis 07/22/2017  . Overweight (BMI 25.0-29.9)  07/22/2017   Past Medical History:  Diagnosis Date  . Allergy   . Arthritis   . Cancer ([place])    skin cancers  . Colon polyps   . Diverticulosis   . Hemorrhoids   . Hyperlipidemia   . Osteopenia   . Osteoporosis     Family History  Problem Relation Age of Onset  . Heart attack Mother   . Lung cancer Father 23  . Colon cancer Neg Hx     Past Surgical History:  Procedure Laterality Date  . COLONOSCOPY    . KNEE SURGERY     bilateral  . TONSILLECTOMY     Social History   Occupational History  . Occupation: Retired    Tobacco Use  . Smoking status: Former Smoker    Last attempt to quit: 03/16/1971    Years since quitting: 46.4  . Smokeless tobacco: Never Used  Substance and Sexual Activity  . Alcohol use: Yes    Alcohol/week: 0.0 oz    Comment: rarely  . Drug use: No  . Sexual activity: Not Currently

## 2017-09-14 ENCOUNTER — Ambulatory Visit: Payer: Medicare Other

## 2017-09-20 NOTE — Progress Notes (Addendum)
Subjective:   Cory Barker is a 77 y.o. male who presents for Medicare Annual/Subsequent preventive examination.  Review of Systems:  No ROS.  Medicare Wellness Visit. Additional risk factors are reflected in the social history.  Cardiac Risk Factors include: advanced age (>73men, >62 women);dyslipidemia;male gender;family history of premature cardiovascular disease   Sleep patterns: Sleeps 5 hours.  Home Safety/Smoke Alarms: Feels safe in home. Smoke alarms in place.  Living environment; residence and Firearm Safety: Lives with wife in 2 story home.  Seat Belt Safety/Bike Helmet: Wears seat belt.   Male:   CCS-Colonoscopy 12/17/2014, Diverticulosis. Recall 5 years.    DEXA-08/17/2017, normal.  PSA-  Lab Results  Component Value Date   PSA 1.31 07/22/2017       Objective:    Vitals: BP 136/78 (BP Location: Left Arm, Patient Position: Sitting, Cuff Size: Normal)   Pulse 95   Ht 5\' 10"  (1.778 m)   Wt 204 lb 8 oz (92.8 kg)   SpO2 99%   BMI 29.34 kg/m   Body mass index is 29.34 kg/m.  Advanced Directives 09/21/2017 12/17/2014 12/03/2014  Does Patient Have a Medical Advance Directive? No No No  Would patient like information on creating a medical advance directive? Yes (MAU/Ambulatory/Procedural Areas - Information given) No - patient declined information -    Tobacco Social History   Tobacco Use  Smoking Status Former Smoker  . Last attempt to quit: 03/16/1971  . Years since quitting: 46.5  Smokeless Tobacco Never Used     Counseling given: Not Answered    Past Medical History:  Diagnosis Date  . Allergy   . Arthritis   . Cancer ([place])    skin cancers  . Colon polyps   . Diverticulosis   . Hemorrhoids   . Hyperlipidemia   . Osteopenia   . Osteoporosis    Past Surgical History:  Procedure Laterality Date  . COLONOSCOPY    . KNEE SURGERY     bilateral  . TONSILLECTOMY     Family History  Problem Relation Age of Onset  . Heart attack Mother   . Lung  cancer Father 32  . Breast cancer Sister   . Colon cancer Neg Hx    Social History   Socioeconomic History  . Marital status: Married    Spouse name: Not on file  . Number of children: 2  . Years of education: Not on file  . Highest education level: Not on file  Occupational History  . Occupation: Retired  Scientific laboratory technician  . Financial resource strain: Not on file  . Food insecurity:    Worry: Not on file    Inability: Not on file  . Transportation needs:    Medical: Not on file    Non-medical: Not on file  Tobacco Use  . Smoking status: Former Smoker    Last attempt to quit: 03/16/1971    Years since quitting: 46.5  . Smokeless tobacco: Never Used  Substance and Sexual Activity  . Alcohol use: Yes    Alcohol/week: 0.0 oz    Comment: rarely  . Drug use: No  . Sexual activity: Not Currently  Lifestyle  . Physical activity:    Days per week: Not on file    Minutes per session: Not on file  . Stress: Not on file  Relationships  . Social connections:    Talks on phone: Not on file    Gets together: Not on file    Attends religious service: Not  on file    Active member of club or organization: Not on file    Attends meetings of clubs or organizations: Not on file    Relationship status: Not on file  Other Topics Concern  . Not on file  Social History Narrative  . Not on file    Outpatient Encounter Medications as of 09/21/2017  Medication Sig  . Calcium Carb-Cholecalciferol (CALCIUM 1000 + D PO) Take 1 tablet by mouth daily.  . Fluocinolone Acetonide 0.01 % OIL Apply topically.   Marland Kitchen glucosamine-chondroitin 500-400 MG tablet Take 1 tablet by mouth daily.  . Magnesium 500 MG CAPS Take 1 capsule by mouth daily.  . Multiple Vitamin (MULTIVITAMIN) tablet Take 1 tablet by mouth daily.  . cyclobenzaprine (FLEXERIL) 5 MG tablet Take 5 mg by mouth 3 (three) times daily as needed for muscle spasms.  . meloxicam (MOBIC) 15 MG tablet Take 15 mg by mouth daily.   No  facility-administered encounter medications on file as of 09/21/2017.     Activities of Daily Living In your present state of health, do you have any difficulty performing the following activities: 09/21/2017 07/22/2017  Hearing? N N  Vision? N N  Difficulty concentrating or making decisions? N N  Walking or climbing stairs? N N  Dressing or bathing? N N  Doing errands, shopping? N N  Preparing Food and eating ? N -  Using the Toilet? N -  In the past six months, have you accidently leaked urine? N -  Do you have problems with loss of bowel control? N -  Managing your Medications? N -  Managing your Finances? N -  Housekeeping or managing your Housekeeping? N -  Some recent data might be hidden    Patient Care Team: Midge Minium, MD as PCP - General (Family Medicine) Irene Shipper, MD as Consulting Physician (Gastroenterology) Hillery Jacks, MD as Referring Physician (Dermatology) [facility]   Assessment:   This is a routine wellness examination for Cory Barker.  Exercise Activities and Dietary recommendations Current Exercise Habits: Home exercise routine, Time (Minutes): > 60, Frequency (Times/Week): 3, Weekly Exercise (Minutes/Week): 0, Exercise limited by: None identified   Diet (meal preparation, eat out, water intake, caffeinated beverages, dairy products, fruits and vegetables): Drinks milk and water.   Breakfast: oatmeal/fruit Lunch: skips. May snack on fruit, nuts, trail mix.  Dinner: protein and vegetables.      Goals    . Weight (lb) < 195 lb (88.5 kg)     Lose weight.        Fall Risk Fall Risk  09/21/2017 07/22/2017  Falls in the past year? Yes Yes  Comment fell over when bending at golf course lost balance when golfing, bent over topick up golf balls  Number falls in past yr: 2 or more 2 or more  Injury with Fall? No No  Follow up Falls prevention discussed Falls prevention discussed    Depression Screen PHQ 2/9 Scores 09/21/2017 07/22/2017   PHQ - 2 Score 0 0  PHQ- 9 Score - 0    Cognitive Function MMSE - Mini Mental State Exam 09/21/2017  Orientation to time 5  Orientation to Place 5  Registration 3  Attention/ Calculation 5  Recall 0  Language- name 2 objects 2  Language- repeat 1  Language- follow 3 step command 3  Language- read & follow direction 1  Write a sentence 1  Copy design 1  Total score 27  Immunization History  Administered Date(s) Administered  . Pneumococcal Polysaccharide-23 11/23/2006    Screening Tests Health Maintenance  Topic Date Due  . TETANUS/TDAP  07/23/2018 (Originally 03/30/1960)  . PNA vac Low Risk Adult (2 of 2 - PCV13) 07/23/2018 (Originally 11/23/2007)  . INFLUENZA VACCINE  10/13/2017  . COLONOSCOPY  12/16/2017      Plan:    Continue doing brain stimulating activities (puzzles, reading, adult coloring books, staying active) to keep memory sharp.   Bring a copy of your living will and/or healthcare power of attorney to your next office visit.  I have personally reviewed and noted the following in the patient's chart:   . Medical and social history . Use of alcohol, tobacco or illicit drugs  . Current medications and supplements . Functional ability and status . Nutritional status . Physical activity . Advanced directives . List of other physicians . Hospitalizations, surgeries, and ER visits in previous 12 months . Vitals . Screenings to include cognitive, depression, and falls . Referrals and appointments  In addition, I have reviewed and discussed with patient certain preventive protocols, quality metrics, and best practice recommendations. A written personalized care plan for preventive services as well as general preventive health recommendations were provided to patient.     Gerilyn Nestle, RN  09/21/2017  PCP Notes: -Declines Prevnar and Shingrix. -F/U with PCP 12/2017.   Reviewed documentation provided by RN and agree w/ above.  Annye Asa, MD

## 2017-09-21 ENCOUNTER — Other Ambulatory Visit: Payer: Self-pay

## 2017-09-21 ENCOUNTER — Ambulatory Visit (INDEPENDENT_AMBULATORY_CARE_PROVIDER_SITE_OTHER): Payer: Medicare Other

## 2017-09-21 VITALS — BP 136/78 | HR 95 | Ht 70.0 in | Wt 204.5 lb

## 2017-09-21 DIAGNOSIS — Z0000 Encounter for general adult medical examination without abnormal findings: Secondary | ICD-10-CM | POA: Diagnosis not present

## 2017-09-21 NOTE — Patient Instructions (Addendum)
Continue doing brain stimulating activities (puzzles, reading, adult coloring books, staying active) to keep memory sharp.   Bring a copy of your living will and/or healthcare power of attorney to your next office visit.   Health Maintenance, Male A healthy lifestyle and preventive care is important for your health and wellness. Ask your health care provider about what schedule of regular examinations is right for you. What should I know about weight and diet? Eat a Healthy Diet  Eat plenty of vegetables, fruits, whole grains, low-fat dairy products, and lean protein.  Do not eat a lot of foods high in solid fats, added sugars, or salt.  Maintain a Healthy Weight Regular exercise can help you achieve or maintain a healthy weight. You should:  Do at least 150 minutes of exercise each week. The exercise should increase your heart rate and make you sweat (moderate-intensity exercise).  Do strength-training exercises at least twice a week.  Watch Your Levels of Cholesterol and Blood Lipids  Have your blood tested for lipids and cholesterol every 5 years starting at 76 years of age. If you are at high risk for heart disease, you should start having your blood tested when you are 76 years old. You may need to have your cholesterol levels checked more often if: ? Your lipid or cholesterol levels are high. ? You are older than 76 years of age. ? You are at high risk for heart disease.  What should I know about cancer screening? Many types of cancers can be detected early and may often be prevented. Lung Cancer  You should be screened every year for lung cancer if: ? You are a current smoker who has smoked for at least 30 years. ? You are a former smoker who has quit within the past 15 years.  Talk to your health care provider about your screening options, when you should start screening, and how often you should be screened.  Colorectal Cancer  Routine colorectal cancer screening  usually begins at 76 years of age and should be repeated every 5-10 years until you are 76 years old. You may need to be screened more often if early forms of precancerous polyps or small growths are found. Your health care provider may recommend screening at an earlier age if you have risk factors for colon cancer.  Your health care provider may recommend using home test kits to check for hidden blood in the stool.  A small camera at the end of a tube can be used to examine your colon (sigmoidoscopy or colonoscopy). This checks for the earliest forms of colorectal cancer.  Prostate and Testicular Cancer  Depending on your age and overall health, your health care provider may do certain tests to screen for prostate and testicular cancer.  Talk to your health care provider about any symptoms or concerns you have about testicular or prostate cancer.  Skin Cancer  Check your skin from head to toe regularly.  Tell your health care provider about any new moles or changes in moles, especially if: ? There is a change in a mole's size, shape, or color. ? You have a mole that is larger than a pencil eraser.  Always use sunscreen. Apply sunscreen liberally and repeat throughout the day.  Protect yourself by wearing long sleeves, pants, a wide-brimmed hat, and sunglasses when outside.  What should I know about heart disease, diabetes, and high blood pressure?  If you are 31-66 years of age, have your blood pressure checked every  3-5 years. If you are 40 years of age or older, have your blood pressure checked every year. You should have your blood pressure measured twice-once when you are at a hospital or clinic, and once when you are not at a hospital or clinic. Record the average of the two measurements. To check your blood pressure when you are not at a hospital or clinic, you can use: ? An automated blood pressure machine at a pharmacy. ? A home blood pressure monitor.  Talk to your health care  provider about your target blood pressure.  If you are between 45-79 years old, ask your health care provider if you should take aspirin to prevent heart disease.  Have regular diabetes screenings by checking your fasting blood sugar level. ? If you are at a normal weight and have a low risk for diabetes, have this test once every three years after the age of 45. ? If you are overweight and have a high risk for diabetes, consider being tested at a younger age or more often.  A one-time screening for abdominal aortic aneurysm (AAA) by ultrasound is recommended for men aged 65-75 years who are current or former smokers. What should I know about preventing infection? Hepatitis B If you have a higher risk for hepatitis B, you should be screened for this virus. Talk with your health care provider to find out if you are at risk for hepatitis B infection. Hepatitis C Blood testing is recommended for:  Everyone born from 1945 through 1965.  Anyone with known risk factors for hepatitis C.  Sexually Transmitted Diseases (STDs)  You should be screened each year for STDs including gonorrhea and chlamydia if: ? You are sexually active and are younger than 76 years of age. ? You are older than 76 years of age and your health care provider tells you that you are at risk for this type of infection. ? Your sexual activity has changed since you were last screened and you are at an increased risk for chlamydia or gonorrhea. Ask your health care provider if you are at risk.  Talk with your health care provider about whether you are at high risk of being infected with HIV. Your health care provider may recommend a prescription medicine to help prevent HIV infection.  What else can I do?  Schedule regular health, dental, and eye exams.  Stay current with your vaccines (immunizations).  Do not use any tobacco products, such as cigarettes, chewing tobacco, and e-cigarettes. If you need help quitting, ask  your health care provider.  Limit alcohol intake to no more than 2 drinks per day. One drink equals 12 ounces of beer, 5 ounces of wine, or 1 ounces of hard liquor.  Do not use street drugs.  Do not share needles.  Ask your health care provider for help if you need support or information about quitting drugs.  Tell your health care provider if you often feel depressed.  Tell your health care provider if you have ever been abused or do not feel safe at home. This information is not intended to replace advice given to you by your health care provider. Make sure you discuss any questions you have with your health care provider. Document Released: 08/28/2007 Document Revised: 10/29/2015 Document Reviewed: 12/03/2014 Elsevier Interactive Patient Education  2018 [employer] Inc[employer]  

## 2017-12-19 ENCOUNTER — Ambulatory Visit (INDEPENDENT_AMBULATORY_CARE_PROVIDER_SITE_OTHER): Payer: Medicare Other | Admitting: Family Medicine

## 2017-12-19 ENCOUNTER — Encounter: Payer: Self-pay | Admitting: Family Medicine

## 2017-12-19 ENCOUNTER — Other Ambulatory Visit: Payer: Self-pay

## 2017-12-19 VITALS — BP 112/72 | HR 52 | Temp 98.1°F | Resp 16 | Ht 70.0 in | Wt 206.1 lb

## 2017-12-19 DIAGNOSIS — E663 Overweight: Secondary | ICD-10-CM | POA: Diagnosis not present

## 2017-12-19 DIAGNOSIS — R269 Unspecified abnormalities of gait and mobility: Secondary | ICD-10-CM

## 2017-12-19 DIAGNOSIS — E785 Hyperlipidemia, unspecified: Secondary | ICD-10-CM

## 2017-12-19 LAB — BASIC METABOLIC PANEL
BUN: 21 mg/dL (ref 6–23)
CALCIUM: 9.5 mg/dL (ref 8.4–10.5)
CO2: 29 meq/L (ref 19–32)
Chloride: 103 mEq/L (ref 96–112)
Creatinine, Ser: 0.84 mg/dL (ref 0.40–1.50)
GFR: 94.24 mL/min (ref >60.00)
Glucose, Bld: 101 mg/dL — ABNORMAL HIGH (ref 70–99)
Potassium: 4.2 mEq/L (ref 3.5–5.1)
SODIUM: 138 meq/L (ref 135–145)

## 2017-12-19 LAB — LIPID PANEL
CHOLESTEROL: 199 mg/dL (ref 0–200)
HDL: 44.3 mg/dL (ref >39.00)
LDL Cholesterol: 134 mg/dL — ABNORMAL HIGH (ref 0–99)
NonHDL: 154.37
TRIGLYCERIDES: 104 mg/dL (ref 0.0–149.0)
Total CHOL/HDL Ratio: 4
VLDL: 20.8 mg/dL (ref 0.0–40.0)

## 2017-12-19 LAB — CBC WITH DIFFERENTIAL/PLATELET
Basophils Absolute: 0.1 10*3/uL (ref 0.0–0.1)
Basophils Relative: 0.9 % (ref 0.0–3.0)
Eosinophils Absolute: 0.3 10*3/uL (ref 0.0–0.7)
Eosinophils Relative: 5.2 % — ABNORMAL HIGH (ref 0.0–5.0)
HCT: 44.7 % (ref 39.0–52.0)
Hemoglobin: 15.2 g/dL (ref 13.0–17.0)
LYMPHS ABS: 1.7 10*3/uL (ref 0.7–4.0)
Lymphocytes Relative: 28.9 % (ref 12.0–46.0)
MCHC: 34.1 g/dL (ref 30.0–36.0)
MCV: 89.5 fl (ref 78.0–100.0)
MONO ABS: 0.5 10*3/uL (ref 0.1–1.0)
MONOS PCT: 8.8 % (ref 3.0–12.0)
Neutro Abs: 3.3 10*3/uL (ref 1.4–7.7)
Neutrophils Relative %: 56.2 % (ref 43.0–77.0)
Platelets: 185 10*3/uL (ref 150.0–400.0)
RBC: 4.99 Mil/uL (ref 4.22–5.81)
RDW: 12.9 % (ref 11.5–15.5)
WBC: 5.9 10*3/uL (ref 4.0–10.5)

## 2017-12-19 LAB — HEPATIC FUNCTION PANEL
ALK PHOS: 55 U/L (ref 39–117)
ALT: 18 U/L (ref 0–53)
AST: 22 U/L (ref 0–37)
Albumin: 4.4 g/dL (ref 3.5–5.2)
Bilirubin, Direct: 0.1 mg/dL (ref 0.0–0.3)
TOTAL PROTEIN: 7.1 g/dL (ref 6.0–8.3)
Total Bilirubin: 0.6 mg/dL (ref 0.2–1.2)

## 2017-12-19 NOTE — Patient Instructions (Signed)
Follow up in 6 months to recheck cholesterol We'll notify you of your lab results and make any changes if needed Continue to work on healthy diet and regular exercise- you look great! Continue to monitor your gait and any difficulties you may be having Call with any questions or concerns Happy Fall!!!

## 2017-12-19 NOTE — Assessment & Plan Note (Signed)
Chronic problem.  Attempting to control w/ diet and exercise.  Check labs and determine if medication is required.  Will follow.

## 2017-12-19 NOTE — Assessment & Plan Note (Signed)
Ongoing issue for pt.  Stressed need for healthy diet and regular exercise.  Check labs to risk stratify.  Will follow 

## 2017-12-19 NOTE — Progress Notes (Signed)
   Subjective:    Patient ID: Cory Barker, male    DOB: 10-29-1940, 77 y.o.   MRN: [id number]  HPI Hyperlipidemia- chronic problem, attempting to control w/ diet and exercise.  Last LDL 123.  Weight is stable.  No CP, SOB, HAs, abd pain, N/V.  Overweight- pt's weight is stable.  Pt reports he is 'using the bike again' for 30 minutes.  Golfing regularly, yard work.  Gait initiation- pt reports he is able to rise to a standing position w/o difficulty but then feels like he needs a 'moment' to get things coordinated again.  Has difficulty sliding in or out of restaurant booths.  Denies festinating gait.  Declines flu shot today.  Declines Prevnar.   Review of Systems For ROS see HPI     Objective:   Physical Exam  Constitutional: He is oriented to person, place, and time. He appears well-developed and well-nourished. No distress.  HENT:  Head: Normocephalic and atraumatic.  Eyes: Pupils are equal, round, and reactive to light. Conjunctivae and EOM are normal.  Neck: Normal range of motion. Neck supple. No thyromegaly present.  Cardiovascular: Normal rate, regular rhythm, normal heart sounds and intact distal pulses.  No murmur heard. Pulmonary/Chest: Effort normal and breath sounds normal. No respiratory distress.  Abdominal: Soft. Bowel sounds are normal. He exhibits no distension.  Musculoskeletal: He exhibits no edema.  Lymphadenopathy:    He has no cervical adenopathy.  Neurological: He is alert and oriented to person, place, and time. He displays normal reflexes. No cranial nerve deficit. Coordination normal.  Skin: Skin is warm and dry.  Psychiatric: He has a normal mood and affect. His behavior is normal.  Vitals reviewed.         Assessment & Plan:  Gait disorder- new.  pt has no difficulty rising from a seated position, no festinating gait.  The momentary need to collect himself upon standing is not unusual for someone his age.  In fact, he describes it as 'I'm moving  like an old man'.  Offered PT but he declined.  No abnormalities on neuro exam today.  Will continue to follow.

## 2018-01-06 ENCOUNTER — Other Ambulatory Visit: Payer: Self-pay

## 2018-01-06 ENCOUNTER — Ambulatory Visit (INDEPENDENT_AMBULATORY_CARE_PROVIDER_SITE_OTHER): Payer: Medicare Other | Admitting: Family Medicine

## 2018-01-06 ENCOUNTER — Encounter: Payer: Self-pay | Admitting: Family Medicine

## 2018-01-06 VITALS — BP 128/76 | HR 59 | Temp 97.6°F | Ht 70.0 in | Wt 205.8 lb

## 2018-01-06 DIAGNOSIS — M2392 Unspecified internal derangement of left knee: Secondary | ICD-10-CM | POA: Diagnosis not present

## 2018-01-06 MED ORDER — MELOXICAM 7.5 MG PO TABS: 7.5000 mg | ORAL_TABLET | Freq: Every day | ORAL | 0 refills | Status: DC

## 2018-01-06 NOTE — Progress Notes (Signed)
Subjective  CC:  Chief Complaint  Patient presents with  . Leg Pain    feels like the muscle in his leg are stretched, he noticed pain after playing Golf. Hurts when he first put weight on it this morning    HPI: Cory Barker is a 77 y.o. male who presents to the office today to address the problems listed above in the chief complaint.  Same day acute visit; PCP not available. New pt to me. Chart reviewed.   77 year old male presents today due to increased problems with left leg.  He reports that he has had minor problems over the last 2 to 3 months but worse since yesterday.  Reports during around a golf yesterday his left leg felt progressively unstable.  Felt stiff and like it would give way especially after standing from a seated position.  Upon standing he would have fleeting sharp left lateral lower leg pain.  He denies knee pain, calf pain or ankle pain.  No hip pain.  He has had osteoarthritic knees and he thinks a meniscal repair in his left knee many years ago.  He denies swelling, warmth or redness.  He is had no injuries.  He is an avid golfer, golfing 3-4 times per week.  He is traveling to [place] next week and will be there for 6 months.  He has had no falls.  In the past he is used Mobic successfully for his knee pain. Assessment  1. Internal derangement of left knee      Plan   Possible meniscus injury or other internal derangement of left knee: Possible meniscus tear, degenerative and chronic with giveaway symptoms versus other.  For now recommend knee support, Mobic as needed, rest, and follow-up while in [place] if symptoms worsen or persist.  Recommend orthopedic evaluation if needed.  Follow up: As needed  No orders of the defined types were placed in this encounter.  Meds ordered this encounter  Medications  . meloxicam (MOBIC) 7.5 MG tablet    Sig: Take 1 tablet (7.5 mg total) by mouth daily.    Dispense:  30 tablet    Refill:  0      I reviewed the  patients updated PMH, FH, and SocHx.    Patient Active Problem List   Diagnosis Date Noted  . Hyperlipidemia 07/22/2017  . Osteoporosis 07/22/2017  . Overweight (BMI 25.0-29.9) 07/22/2017   Current Meds  Medication Sig  . Calcium Carb-Cholecalciferol (CALCIUM 1000 + D PO) Take 1 tablet by mouth daily.  . Fluocinolone Acetonide 0.01 % OIL Apply topically.   . Magnesium 500 MG CAPS Take 1 capsule by mouth daily.  . Multiple Vitamin (MULTIVITAMIN) tablet Take 1 tablet by mouth daily.    Allergies: Patient is allergic to penicillins. Family History: Patient family history includes Breast cancer in his sister; Heart attack in his mother; Lung cancer (age of onset: 37) in his father. Social History:  Patient  reports that he quit smoking about 46 years ago. He has never used smokeless tobacco. He reports that he drinks alcohol. He reports that he does not use drugs.  Review of Systems: Constitutional: Negative for fever malaise or anorexia Cardiovascular: negative for chest pain Respiratory: negative for SOB or persistent cough Gastrointestinal: negative for abdominal pain  Objective  Vitals: BP 128/76   Pulse (!) 59   Temp 97.6 F (36.4 C)   Ht 5\' 10"  (1.778 m)   Wt 205 lb 12.8 oz (93.4 kg)   SpO2  96%   BMI 29.53 kg/m  General: no acute distress , A&Ox3 HEENT: PEERL, conjunctiva normal, Oropharynx moist,neck is supple Cardiovascular:  RRR without murmur or gallop.  Respiratory:  Good breath sounds bilaterally, CTAB with normal respiratory effort Skin:  Warm, no rashes Left knee: Normal appearing, no effusion, no warmth, no erythema.  Mildly decreased extension, full flexion without pain.  No popliteal tenderness or mass.  No joint tenderness, mild clunk with medial meniscus with McMurray's.  Negative Lachman's.  Patient able to walk on toes, heels without calf pain.  No cords palpated.  Able to do a deep squat without pain.  Normal gait     Commons side effects, risks,  benefits, and alternatives for medications and treatment plan prescribed today were discussed, and the patient expressed understanding of the given instructions. Patient is instructed to call or message via MyChart if he/she has any questions or concerns regarding our treatment plan. No barriers to understanding were identified. We discussed Red Flag symptoms and signs in detail. Patient expressed understanding regarding what to do in case of urgent or emergency type symptoms.   Medication list was reconciled, printed and provided to the patient in AVS. Patient instructions and summary information was reviewed with the patient as documented in the AVS. This note was prepared with assistance of Dragon voice recognition software. Occasional wrong-word or sound-a-like substitutions may have occurred due to the inherent limitations of voice recognition software

## 2018-01-06 NOTE — Patient Instructions (Addendum)
Please f/u with orthopedics if worsens.    Meniscus Tear A meniscus tear is a knee injury in which a piece of the meniscus is torn. The meniscus is a thick, rubbery, wedge-shaped cartilage in the knee. Two menisci are located in each knee. They sit between the upper bone (femur) and lower bone (tibia) that make up the knee joint. Each meniscus acts as a shock absorber for the knee. A torn meniscus is one of the most common types of knee injuries. This injury can range from mild to severe. Surgery may be needed for a severe tear. What are the causes? This injury may be caused by any squatting, twisting, or pivoting movement. Sports-related injuries are the most common cause. These often occur from:  Running and stopping suddenly.  Changing direction.  Being tackled or knocked off your feet.  As people get older, their meniscus gets thinner and weaker. In these people, tears can happen more easily, such as from climbing stairs. What increases the risk? This injury is more likely to happen to:  People who play contact sports.  Males.  People who are 35?76 years of age.  What are the signs or symptoms? Symptoms of this injury include:  Knee pain, especially at the side of the knee joint. You may feel pain when the injury occurs, or you may only hear a pop and feel pain later.  A feeling that your knee is clicking, catching, locking, or giving way.  Not being able to fully bend or extend your knee.  Bruising or swelling in your knee.  How is this diagnosed? This injury may be diagnosed based on your symptoms and a physical exam. The physical exam may include:  Moving your knee in different ways.  Feeling for tenderness.  Listening for a clicking sound.  Checking if your knee locks or catches.  You may also have tests, such as:  X-rays.  MRI.  A procedure to look inside your knee with a narrow surgical telescope (arthroscopy).  You may be referred to a knee specialist  (orthopedic surgeon). How is this treated? Treatment for this injury depends on the severity of the tear. Treatment for a mild tear may include:  Rest.  Medicine to reduce pain and swelling. This is usually a nonsteroidal anti-inflammatory drug (NSAID).  A knee brace or an elastic sleeve or wrap.  Using crutches or a walker to keep weight off your knee and to help you walk.  Exercises to strengthen your knee (physical therapy).  You may need surgery if you have a severe tear or if other treatments are not working. Follow these instructions at home: Managing pain and swelling  Take over-the-counter and prescription medicines only as told by your health care provider.  If directed, apply ice to the injured area: ? Put ice in a plastic bag. ? Place a towel between your skin and the bag. ? Leave the ice on for 20 minutes, 2-3 times per day.  Raise (elevate) the injured area above the level of your heart while you are sitting or lying down. Activity  Do not use the injured limb to support your body weight until your health care provider says that you can. Use crutches or a walker as told by your health care provider.  Return to your normal activities as told by your health care provider. Ask your health care provider what activities are safe for you.  Perform range-of-motion exercises only as told by your health care provider.  Begin doing  exercises to strengthen your knee and leg muscles only as told by your health care provider. After you recover, your health care provider may recommend these exercises to help prevent another injury. General instructions  Use a knee brace or elastic wrap as told by your health care provider.  Keep all follow-up visits as told by your health care provider. This is important. Contact a health care provider if:  You have a fever.  Your knee becomes red, tender, or swollen.  Your pain medicine is not helping.  Your symptoms get worse or do  not improve after 2 weeks of home care. This information is not intended to replace advice given to you by your health care provider. Make sure you discuss any questions you have with your health care provider. Document Released: 05/22/2002 Document Revised: 08/07/2015 Document Reviewed: 06/24/2014 Elsevier Interactive Patient Education  [employer].

## 2018-01-30 ENCOUNTER — Other Ambulatory Visit: Payer: Self-pay | Admitting: Family Medicine

## 2018-03-06 ENCOUNTER — Encounter (INDEPENDENT_AMBULATORY_CARE_PROVIDER_SITE_OTHER): Payer: Self-pay | Admitting: Orthopaedic Surgery

## 2018-03-06 ENCOUNTER — Ambulatory Visit (INDEPENDENT_AMBULATORY_CARE_PROVIDER_SITE_OTHER): Payer: Medicare Other

## 2018-03-06 ENCOUNTER — Ambulatory Visit (INDEPENDENT_AMBULATORY_CARE_PROVIDER_SITE_OTHER): Payer: Medicare Other | Admitting: Orthopaedic Surgery

## 2018-03-06 ENCOUNTER — Ambulatory Visit (INDEPENDENT_AMBULATORY_CARE_PROVIDER_SITE_OTHER): Payer: Self-pay

## 2018-03-06 DIAGNOSIS — G8929 Other chronic pain: Secondary | ICD-10-CM | POA: Diagnosis not present

## 2018-03-06 DIAGNOSIS — M1711 Unilateral primary osteoarthritis, right knee: Secondary | ICD-10-CM | POA: Diagnosis not present

## 2018-03-06 DIAGNOSIS — M25562 Pain in left knee: Secondary | ICD-10-CM

## 2018-03-06 DIAGNOSIS — M1712 Unilateral primary osteoarthritis, left knee: Secondary | ICD-10-CM

## 2018-03-06 DIAGNOSIS — M25561 Pain in right knee: Secondary | ICD-10-CM | POA: Diagnosis not present

## 2018-03-06 MED ORDER — MELOXICAM 15 MG PO TABS: 15.0000 mg | ORAL_TABLET | Freq: Every day | ORAL | 6 refills | Status: DC

## 2018-03-06 NOTE — Progress Notes (Signed)
Office Visit Note   Patient: Cory Barker           Date of Birth: Aug 27, 1940           MRN: [id number] Visit Date: 03/06/2018              Requested by: Midge Minium, MD [id number] [place], [place] [address] PCP: Midge Minium, MD   Assessment & Plan: Visit Diagnoses:  1. Chronic pain of left knee   2. Chronic pain of right knee   3. Unilateral primary osteoarthritis, left knee   4. Unilateral primary osteoarthritis, right knee     Plan: The patient does understand that he has significant arthritis in both knees.  Right now it is not very painful to him but he is just frustrated at the weakness of his knees and how this is impacting his mobility and his quality of life.  He is a perfect candidate for outpatient physical therapy to strengthen his quad muscles and to work on his mobility.  I have recommended at least him continuing his meloxicam and trying even a copper fit knee sleeve for his knees.  I did give him a prescription for outpatient physical therapy in [place].  I think this will help him quite a bit.  He does not need injections right now.  All question concerns were answered and addressed.  He does understand the extent of the arthritis disease of his knees given his plain film findings and physical exam findings.  Follow-Up Instructions: Return if symptoms worsen or fail to improve.   Orders:  Orders Placed This Encounter  Procedures  . XR Knee 1-2 Views Left  . XR Knee 1-2 Views Right   Meds ordered this encounter  Medications  . meloxicam (MOBIC) 15 MG tablet    Sig: Take 1 tablet (15 mg total) by mouth daily.    Dispense:  30 tablet    Refill:  6      Procedures: No procedures performed   Clinical Data: No additional findings.   Subjective: Chief Complaint  Patient presents with  . Left Knee - Pain  The patient is a very pleasant and avid golfer who is 77 years old.  He actually lives in [place] during the winter and is heading  back down there after Christmas.  His left knee has been hurting him quite a bit and this is mainly related to his golf.  He said the most frustrating thing though is that his knees are stiff and that his quads are weak and he has trouble getting up from a seated position.  He denies any injury specifically to his knees.  He is never had any type of injections.  He does take meloxicam and that does help and he would like a refill of this today.  He is not a smoker not a diabetic.  He says golf is his biggest passion in terms of activities.  HPI  Review of Systems He currently denies any headache, chest pain, shortness of breath, fever, chills, nausea, vomiting.  Objective: Vital Signs: There were no vitals taken for this visit.  Physical Exam He is alert orient x3 and in no acute distress Ortho Exam Examination of both knees shows no effusion.  Both knees have slight flexion contractures.  Both knees have good range of motion overall.  Both knees have weak quad muscles. Specialty Comments:  No specialty comments available.  Imaging: Xr Knee 1-2 Views Left  Result Date: 03/06/2018 2 views of the left knee show tricompartmental arthritic changes.  There are particular osteophytes in all 3 compartments.  There is significant medial joint space narrowing.  There is no effusion or evidence of acute injury or fracture.  Xr Knee 1-2 Views Right  Result Date: 03/06/2018 2 views of the right knee show no acute findings.  There is no effusion.  There is chronic tricompartmental arthritic changes involving all 3 compartments.    PMFS History: Patient Active Problem List   Diagnosis Date Noted  . Unilateral primary osteoarthritis, left knee 03/06/2018  . Unilateral primary osteoarthritis, right knee 03/06/2018  . Hyperlipidemia 07/22/2017  . Osteoporosis 07/22/2017  . Overweight (BMI 25.0-29.9) 07/22/2017   Past Medical History:  Diagnosis Date  . Allergy   . Arthritis   . Cancer ([place])     skin cancers  . Colon polyps   . Diverticulosis   . Hemorrhoids   . Hyperlipidemia   . Osteopenia   . Osteoporosis     Family History  Problem Relation Age of Onset  . Heart attack Mother   . Lung cancer Father 78  . Breast cancer Sister   . Colon cancer Neg Hx     Past Surgical History:  Procedure Laterality Date  . COLONOSCOPY    . KNEE SURGERY     bilateral  . TONSILLECTOMY     Social History   Occupational History  . Occupation: Retired  Tobacco Use  . Smoking status: Former Smoker    Last attempt to quit: 03/16/1971    Years since quitting: 47.0  . Smokeless tobacco: Never Used  Substance and Sexual Activity  . Alcohol use: Yes    Alcohol/week: 0.0 standard drinks    Comment: rarely  . Drug use: No  . Sexual activity: Not Currently

## 2018-03-29 DIAGNOSIS — Z85828 Personal history of other malignant neoplasm of skin: Secondary | ICD-10-CM | POA: Diagnosis not present

## 2018-03-29 DIAGNOSIS — S90122A Contusion of left lesser toe(s) without damage to nail, initial encounter: Secondary | ICD-10-CM | POA: Diagnosis not present

## 2018-03-29 DIAGNOSIS — L821 Other seborrheic keratosis: Secondary | ICD-10-CM | POA: Diagnosis not present

## 2018-03-29 DIAGNOSIS — L814 Other melanin hyperpigmentation: Secondary | ICD-10-CM | POA: Diagnosis not present

## 2018-03-29 DIAGNOSIS — L308 Other specified dermatitis: Secondary | ICD-10-CM | POA: Diagnosis not present

## 2018-03-29 DIAGNOSIS — D2339 Other benign neoplasm of skin of other parts of face: Secondary | ICD-10-CM | POA: Diagnosis not present

## 2018-03-29 DIAGNOSIS — L720 Epidermal cyst: Secondary | ICD-10-CM | POA: Diagnosis not present

## 2018-03-29 DIAGNOSIS — L853 Xerosis cutis: Secondary | ICD-10-CM | POA: Diagnosis not present

## 2018-03-29 DIAGNOSIS — L570 Actinic keratosis: Secondary | ICD-10-CM | POA: Diagnosis not present

## 2018-03-29 DIAGNOSIS — Z08 Encounter for follow-up examination after completed treatment for malignant neoplasm: Secondary | ICD-10-CM | POA: Diagnosis not present

## 2018-04-11 DIAGNOSIS — R262 Difficulty in walking, not elsewhere classified: Secondary | ICD-10-CM | POA: Diagnosis not present

## 2018-04-11 DIAGNOSIS — M170 Bilateral primary osteoarthritis of knee: Secondary | ICD-10-CM | POA: Diagnosis not present

## 2018-04-11 DIAGNOSIS — M25562 Pain in left knee: Secondary | ICD-10-CM | POA: Diagnosis not present

## 2018-04-11 DIAGNOSIS — M25561 Pain in right knee: Secondary | ICD-10-CM | POA: Diagnosis not present

## 2018-04-13 DIAGNOSIS — M25561 Pain in right knee: Secondary | ICD-10-CM | POA: Diagnosis not present

## 2018-04-13 DIAGNOSIS — R262 Difficulty in walking, not elsewhere classified: Secondary | ICD-10-CM | POA: Diagnosis not present

## 2018-04-13 DIAGNOSIS — M170 Bilateral primary osteoarthritis of knee: Secondary | ICD-10-CM | POA: Diagnosis not present

## 2018-04-13 DIAGNOSIS — M25562 Pain in left knee: Secondary | ICD-10-CM | POA: Diagnosis not present

## 2018-04-18 DIAGNOSIS — M170 Bilateral primary osteoarthritis of knee: Secondary | ICD-10-CM | POA: Diagnosis not present

## 2018-04-18 DIAGNOSIS — M25562 Pain in left knee: Secondary | ICD-10-CM | POA: Diagnosis not present

## 2018-04-18 DIAGNOSIS — M25561 Pain in right knee: Secondary | ICD-10-CM | POA: Diagnosis not present

## 2018-04-18 DIAGNOSIS — R262 Difficulty in walking, not elsewhere classified: Secondary | ICD-10-CM | POA: Diagnosis not present

## 2018-04-20 DIAGNOSIS — M170 Bilateral primary osteoarthritis of knee: Secondary | ICD-10-CM | POA: Diagnosis not present

## 2018-04-20 DIAGNOSIS — M25561 Pain in right knee: Secondary | ICD-10-CM | POA: Diagnosis not present

## 2018-04-20 DIAGNOSIS — M25562 Pain in left knee: Secondary | ICD-10-CM | POA: Diagnosis not present

## 2018-04-20 DIAGNOSIS — R262 Difficulty in walking, not elsewhere classified: Secondary | ICD-10-CM | POA: Diagnosis not present

## 2018-04-25 DIAGNOSIS — R262 Difficulty in walking, not elsewhere classified: Secondary | ICD-10-CM | POA: Diagnosis not present

## 2018-04-25 DIAGNOSIS — M170 Bilateral primary osteoarthritis of knee: Secondary | ICD-10-CM | POA: Diagnosis not present

## 2018-04-25 DIAGNOSIS — M25562 Pain in left knee: Secondary | ICD-10-CM | POA: Diagnosis not present

## 2018-04-25 DIAGNOSIS — M25561 Pain in right knee: Secondary | ICD-10-CM | POA: Diagnosis not present

## 2018-04-27 DIAGNOSIS — M170 Bilateral primary osteoarthritis of knee: Secondary | ICD-10-CM | POA: Diagnosis not present

## 2018-04-27 DIAGNOSIS — M25561 Pain in right knee: Secondary | ICD-10-CM | POA: Diagnosis not present

## 2018-04-27 DIAGNOSIS — M25562 Pain in left knee: Secondary | ICD-10-CM | POA: Diagnosis not present

## 2018-04-27 DIAGNOSIS — R262 Difficulty in walking, not elsewhere classified: Secondary | ICD-10-CM | POA: Diagnosis not present

## 2018-05-02 DIAGNOSIS — M170 Bilateral primary osteoarthritis of knee: Secondary | ICD-10-CM | POA: Diagnosis not present

## 2018-05-02 DIAGNOSIS — M25562 Pain in left knee: Secondary | ICD-10-CM | POA: Diagnosis not present

## 2018-05-02 DIAGNOSIS — M25561 Pain in right knee: Secondary | ICD-10-CM | POA: Diagnosis not present

## 2018-05-02 DIAGNOSIS — R262 Difficulty in walking, not elsewhere classified: Secondary | ICD-10-CM | POA: Diagnosis not present

## 2018-05-04 DIAGNOSIS — M25561 Pain in right knee: Secondary | ICD-10-CM | POA: Diagnosis not present

## 2018-05-04 DIAGNOSIS — R262 Difficulty in walking, not elsewhere classified: Secondary | ICD-10-CM | POA: Diagnosis not present

## 2018-05-04 DIAGNOSIS — M170 Bilateral primary osteoarthritis of knee: Secondary | ICD-10-CM | POA: Diagnosis not present

## 2018-05-04 DIAGNOSIS — M25562 Pain in left knee: Secondary | ICD-10-CM | POA: Diagnosis not present

## 2018-05-09 DIAGNOSIS — M170 Bilateral primary osteoarthritis of knee: Secondary | ICD-10-CM | POA: Diagnosis not present

## 2018-05-09 DIAGNOSIS — R262 Difficulty in walking, not elsewhere classified: Secondary | ICD-10-CM | POA: Diagnosis not present

## 2018-05-09 DIAGNOSIS — M25562 Pain in left knee: Secondary | ICD-10-CM | POA: Diagnosis not present

## 2018-05-09 DIAGNOSIS — M25561 Pain in right knee: Secondary | ICD-10-CM | POA: Diagnosis not present

## 2018-05-11 DIAGNOSIS — M170 Bilateral primary osteoarthritis of knee: Secondary | ICD-10-CM | POA: Diagnosis not present

## 2018-05-11 DIAGNOSIS — M25561 Pain in right knee: Secondary | ICD-10-CM | POA: Diagnosis not present

## 2018-05-11 DIAGNOSIS — M25562 Pain in left knee: Secondary | ICD-10-CM | POA: Diagnosis not present

## 2018-05-11 DIAGNOSIS — R262 Difficulty in walking, not elsewhere classified: Secondary | ICD-10-CM | POA: Diagnosis not present

## 2018-05-16 DIAGNOSIS — R262 Difficulty in walking, not elsewhere classified: Secondary | ICD-10-CM | POA: Diagnosis not present

## 2018-05-16 DIAGNOSIS — M25561 Pain in right knee: Secondary | ICD-10-CM | POA: Diagnosis not present

## 2018-05-16 DIAGNOSIS — M25562 Pain in left knee: Secondary | ICD-10-CM | POA: Diagnosis not present

## 2018-05-16 DIAGNOSIS — M170 Bilateral primary osteoarthritis of knee: Secondary | ICD-10-CM | POA: Diagnosis not present

## 2018-05-18 DIAGNOSIS — M25561 Pain in right knee: Secondary | ICD-10-CM | POA: Diagnosis not present

## 2018-05-18 DIAGNOSIS — R262 Difficulty in walking, not elsewhere classified: Secondary | ICD-10-CM | POA: Diagnosis not present

## 2018-05-18 DIAGNOSIS — M170 Bilateral primary osteoarthritis of knee: Secondary | ICD-10-CM | POA: Diagnosis not present

## 2018-05-18 DIAGNOSIS — M25562 Pain in left knee: Secondary | ICD-10-CM | POA: Diagnosis not present

## 2018-07-17 ENCOUNTER — Ambulatory Visit (INDEPENDENT_AMBULATORY_CARE_PROVIDER_SITE_OTHER): Payer: Medicare Other | Admitting: Family Medicine

## 2018-07-17 ENCOUNTER — Other Ambulatory Visit: Payer: Self-pay

## 2018-07-17 ENCOUNTER — Encounter: Payer: Self-pay | Admitting: Family Medicine

## 2018-07-17 VITALS — BP 118/78 | HR 72 | Temp 97.9°F | Resp 16 | Ht 70.0 in | Wt 192.5 lb

## 2018-07-17 DIAGNOSIS — E785 Hyperlipidemia, unspecified: Secondary | ICD-10-CM | POA: Diagnosis not present

## 2018-07-17 DIAGNOSIS — E663 Overweight: Secondary | ICD-10-CM | POA: Diagnosis not present

## 2018-07-17 DIAGNOSIS — M62838 Other muscle spasm: Secondary | ICD-10-CM | POA: Diagnosis not present

## 2018-07-17 LAB — CBC WITH DIFFERENTIAL/PLATELET
Basophils Absolute: 0 10*3/uL (ref 0.0–0.1)
Basophils Relative: 0.4 % (ref 0.0–3.0)
Eosinophils Absolute: 0.2 10*3/uL (ref 0.0–0.7)
Eosinophils Relative: 4.1 % (ref 0.0–5.0)
HCT: 43.8 % (ref 39.0–52.0)
Hemoglobin: 15.3 g/dL (ref 13.0–17.0)
Lymphocytes Relative: 32.7 % (ref 12.0–46.0)
Lymphs Abs: 1.8 10*3/uL (ref 0.7–4.0)
MCHC: 34.9 g/dL (ref 30.0–36.0)
MCV: 89.4 fl (ref 78.0–100.0)
Monocytes Absolute: 0.5 10*3/uL (ref 0.1–1.0)
Monocytes Relative: 10.1 % (ref 3.0–12.0)
Neutro Abs: 2.8 10*3/uL (ref 1.4–7.7)
Neutrophils Relative %: 52.7 % (ref 43.0–77.0)
Platelets: 178 10*3/uL (ref 150.0–400.0)
RBC: 4.89 Mil/uL (ref 4.22–5.81)
RDW: 13.6 % (ref 11.5–15.5)
WBC: 5.4 10*3/uL (ref 4.0–10.5)

## 2018-07-17 LAB — LIPID PANEL
Cholesterol: 194 mg/dL (ref 0–200)
HDL: 41.8 mg/dL (ref >39.00)
LDL Cholesterol: 133 mg/dL — ABNORMAL HIGH (ref 0–99)
NonHDL: 152.44
Total CHOL/HDL Ratio: 5
Triglycerides: 97 mg/dL (ref 0.0–149.0)
VLDL: 19.4 mg/dL (ref 0.0–40.0)

## 2018-07-17 LAB — BASIC METABOLIC PANEL
BUN: 19 mg/dL (ref 6–23)
CO2: 28 mEq/L (ref 19–32)
Calcium: 9.6 mg/dL (ref 8.4–10.5)
Chloride: 102 mEq/L (ref 96–112)
Creatinine, Ser: 0.91 mg/dL (ref 0.40–1.50)
GFR: 80.72 mL/min (ref >60.00)
Glucose, Bld: 86 mg/dL (ref 70–99)
Potassium: 4.3 mEq/L (ref 3.5–5.1)
Sodium: 139 mEq/L (ref 135–145)

## 2018-07-17 LAB — TSH: TSH: 1.17 u[IU]/mL (ref 0.35–4.50)

## 2018-07-17 LAB — HEPATIC FUNCTION PANEL
ALT: 17 U/L (ref 0–53)
AST: 21 U/L (ref 0–37)
Albumin: 4.4 g/dL (ref 3.5–5.2)
Alkaline Phosphatase: 60 U/L (ref 39–117)
Bilirubin, Direct: 0.1 mg/dL (ref 0.0–0.3)
Total Bilirubin: 0.8 mg/dL (ref 0.2–1.2)
Total Protein: 7 g/dL (ref 6.0–8.3)

## 2018-07-17 NOTE — Assessment & Plan Note (Signed)
Pt is down 15 lbs since last visit.  Applauded his efforts.  Encouraged him to continue.  Will follow.

## 2018-07-17 NOTE — Assessment & Plan Note (Signed)
Chronic problem.  Attempting to control w/ diet and exercise.  He is down 15 lbs since last visit.  Applauded his efforts.  Check labs.  Adjust tx prn

## 2018-07-17 NOTE — Progress Notes (Signed)
   Subjective:    Patient ID: Cory Barker, male    DOB: June 22, 1940, 78 y.o.   MRN: [id number]  HPI Hyperlipidemia- chronic problem, attempting to control w/ diet and exercise.  Not currently on medication.  Pt is down 15 lbs since last visit.  Pt has been golfing regularly, increased walking.  Overweight- pt is down 15 lbs since last visit.  Increased exercise recently.  L neck/shoulder pain- pt reports he will feel tightness at base of neck and an occasional 'pulling discomfort'.     Review of Systems For ROS see HPI     Objective:   Physical Exam Vitals signs reviewed.  Constitutional:      General: He is not in acute distress.    Appearance: He is well-developed.  HENT:     Head: Normocephalic and atraumatic.  Eyes:     Conjunctiva/sclera: Conjunctivae normal.     Pupils: Pupils are equal, round, and reactive to light.  Neck:     Musculoskeletal: Normal range of motion and neck supple. Muscular tenderness (mild TTP over L trap) present.     Thyroid: No thyromegaly.  Cardiovascular:     Rate and Rhythm: Normal rate and regular rhythm.     Heart sounds: Normal heart sounds. No murmur.  Pulmonary:     Effort: Pulmonary effort is normal. No respiratory distress.     Breath sounds: Normal breath sounds.  Abdominal:     General: Bowel sounds are normal. There is no distension.     Palpations: Abdomen is soft.  Lymphadenopathy:     Cervical: No cervical adenopathy.  Skin:    General: Skin is warm and dry.  Neurological:     Mental Status: He is alert and oriented to person, place, and time.     Cranial Nerves: No cranial nerve deficit.  Psychiatric:        Behavior: Behavior normal.           Assessment & Plan:  Trap spasm- mild, L sided.  Reviewed dx and supportive measures.  Pt expressed understanding and is in agreement w/ plan.

## 2018-07-17 NOTE — Patient Instructions (Signed)
Follow up in 6 months to recheck cholesterol We'll notify you of your lab results and make any changes if needed Keep up the good work on healthy diet and regular exercise- you look great! The pain you're describing in your neck/shoulder is a muscle tightness- this will improve w/ heating pad, tylenol, or ibuprofen as needed Call with any questions or concerns Stay Safe!

## 2018-07-18 ENCOUNTER — Encounter: Payer: Self-pay | Admitting: General Practice

## 2018-09-26 NOTE — Progress Notes (Addendum)
Subjective:   Cory Barker is a 78 y.o. male who presents for Medicare Annual/Subsequent preventive examination.  Review of Systems:  No ROS.  Medicare Wellness Visit.   See social history for additional risk factors.  Cardiac Risk Factors include: advanced age (>78men, >63 women);family history of premature cardiovascular disease;male gender   Sleep patterns: Sleeps 5 hours.   Home Safety/Smoke Alarms: Feels safe in home. Smoke alarms in place.  Living environment; residence and Firearm Safety: Lives with wife in 2 story home.  Seat Belt Safety/Bike Helmet: Wears seat belt.    Male:   CCS-Colonoscopy 12/17/2014, Diverticulosis. Recall 5 years.      PSA-  Lab Results  Component Value Date   PSA 1.31 07/22/2017       Objective:    Vitals: BP 136/70 (BP Location: Left Arm, Patient Position: Sitting, Cuff Size: Normal)   Pulse (!) 49   Ht 5\' 10"  (1.778 m)   Wt 195 lb 8 oz (88.7 kg)   SpO2 96%   BMI 28.05 kg/m   Body mass index is 28.05 kg/m.  Advanced Directives 09/27/2018 09/21/2017 12/17/2014 12/03/2014  Does Patient Have a Medical Advance Directive? No No No No  Would patient like information on creating a medical advance directive? Yes (MAU/Ambulatory/Procedural Areas - Information given) Yes (MAU/Ambulatory/Procedural Areas - Information given) No - patient declined information -    Tobacco Social History   Tobacco Use  Smoking Status Former Smoker  . Quit date: 03/16/1971  . Years since quitting: 47.5  Smokeless Tobacco Never Used     Counseling given: Not Answered   Past Medical History:  Diagnosis Date  . Allergy   . Arthritis   . Cancer ([place])    skin cancers  . Colon polyps   . Diverticulosis   . Hemorrhoids   . Hyperlipidemia   . Osteopenia   . Osteoporosis    Past Surgical History:  Procedure Laterality Date  . COLONOSCOPY    . KNEE SURGERY     bilateral  . TONSILLECTOMY     Family History  Problem Relation Age of Onset  . Heart attack  Mother   . Lung cancer Father 4  . Breast cancer Sister   . Colon cancer Neg Hx    Social History   Socioeconomic History  . Marital status: Married    Spouse name: Not on file  . Number of children: 2  . Years of education: Not on file  . Highest education level: Not on file  Occupational History  . Occupation: Retired  Scientific laboratory technician  . Financial resource strain: Not on file  . Food insecurity    Worry: Not on file    Inability: Not on file  . Transportation needs    Medical: Not on file    Non-medical: Not on file  Tobacco Use  . Smoking status: Former Smoker    Quit date: 03/16/1971    Years since quitting: 47.5  . Smokeless tobacco: Never Used  Substance and Sexual Activity  . Alcohol use: Yes    Alcohol/week: 0.0 standard drinks    Comment: rarely  . Drug use: No  . Sexual activity: Not Currently  Lifestyle  . Physical activity    Days per week: Not on file    Minutes per session: Not on file  . Stress: Not on file  Relationships  . Social Herbalist on phone: Not on file    Gets together: Not on file  Attends religious service: Not on file    Active member of club or organization: Not on file    Attends meetings of clubs or organizations: Not on file    Relationship status: Not on file  Other Topics Concern  . Not on file  Social History Narrative  . Not on file    Outpatient Encounter Medications as of 09/27/2018  Medication Sig  . Calcium Carb-Cholecalciferol (CALCIUM 1000 + D PO) Take 1 tablet by mouth daily.  . Cyanocobalamin (VITAMIN B-12 PO) Take 1,500 mcg by mouth.  . cyclobenzaprine (FLEXERIL) 5 MG tablet Take 5 mg by mouth 3 (three) times daily as needed for muscle spasms.  . Fluocinolone Acetonide 0.01 % OIL Apply topically.   . gabapentin (NEURONTIN) 300 MG capsule Take 300 mg by mouth 3 (three) times daily.  Marland Kitchen glucosamine-chondroitin 500-400 MG tablet Take 1 tablet by mouth daily.  . meloxicam (MOBIC) 15 MG tablet Take 1 tablet  (15 mg total) by mouth daily.  . Multiple Vitamin (MULTIVITAMIN) tablet Take 1 tablet by mouth daily.  . [DISCONTINUED] Magnesium 500 MG CAPS Take 1 capsule by mouth daily.   No facility-administered encounter medications on file as of 09/27/2018.     Activities of Daily Living In your present state of health, do you have any difficulty performing the following activities: 09/27/2018 07/17/2018  Hearing? N N  Vision? N N  Difficulty concentrating or making decisions? N N  Walking or climbing stairs? N N  Dressing or bathing? N N  Doing errands, shopping? N N  Preparing Food and eating ? N -  Using the Toilet? N -  In the past six months, have you accidently leaked urine? N -  Do you have problems with loss of bowel control? N -  Managing your Medications? N -  Managing your Finances? N -  Housekeeping or managing your Housekeeping? N -  Some recent data might be hidden    Patient Care Team: Midge Minium, MD as PCP - General (Family Medicine) Irene Shipper, MD as Consulting Physician (Gastroenterology) [facility] [place], Tamala Fothergill, DPM as Consulting Physician (Podiatry)   Assessment:   This is a routine wellness examination for Cory Barker.  Exercise Activities and Dietary recommendations Current Exercise Habits: Home exercise routine, Type of exercise: walking;Other - see comments(golf, 10K steps/day), Frequency (Times/Week): 7, Exercise limited by: None identified   Diet (meal preparation, eat out, water intake, caffeinated beverages, dairy products, fruits and vegetables): Drinks water and milk (skim)  Breakfast: Cereal (steel cut oats, crunchy corn) Lunch: Skip Dinner: protein and vegetables.      Goals      Patient Stated   . Weight (lb) < 187 lb (84.8 kg) (pt-stated)     Lose weight by increasing activity and decreasing sugar intake.       Other   . Weight (lb) < 195 lb (88.5 kg)     Lose weight.        Fall Risk Fall Risk  09/27/2018 07/17/2018  12/19/2017 09/21/2017 07/22/2017  Falls in the past year? 1 0 No Yes Yes  Comment leaning over too far at golf course - - fell over when bending at golf course lost balance when golfing, bent over topick up golf balls  Number falls in past yr: 1 0 - 2 or more 2 or more  Injury with Fall? 0 0 - No No  Follow up Falls prevention discussed - - Falls prevention discussed Falls prevention discussed  Depression Screen PHQ 2/9 Scores 09/27/2018 07/17/2018 12/19/2017 09/21/2017  PHQ - 2 Score 0 0 0 0  PHQ- 9 Score - - 0 -    Cognitive Function MMSE - Mini Mental State Exam 09/27/2018 09/21/2017  Orientation to time 5 5  Orientation to Place 5 5  Registration 3 3  Attention/ Calculation 5 5  Recall 1 0  Language- name 2 objects 2 2  Language- repeat 1 1  Language- follow 3 step command 3 3  Language- read & follow direction 1 1  Write a sentence 1 1  Copy design 1 1  Total score 28 27        Immunization History  Administered Date(s) Administered  . Pneumococcal Polysaccharide-23 11/23/2006    Screening Tests Health Maintenance  Topic Date Due  . TETANUS/TDAP  03/30/1960  . PNA vac Low Risk Adult (2 of 2 - PCV13) 11/23/2007  . INFLUENZA VACCINE  10/14/2018  . COLONOSCOPY  12/17/2019        Plan:    Bring a copy of your living will and/or healthcare power of attorney to your next office visit.  Continue doing brain stimulating activities (puzzles, reading, adult coloring books, staying active) to keep memory sharp.   I have personally reviewed and noted the following in the patient's chart:   . Medical and social history . Use of alcohol, tobacco or illicit drugs  . Current medications and supplements . Functional ability and status . Nutritional status . Physical activity . Advanced directives . List of other physicians . Hospitalizations, surgeries, and ER visits in previous 12 months . Vitals . Screenings to include cognitive, depression, and falls . Referrals and  appointments  In addition, I have reviewed and discussed with patient certain preventive protocols, quality metrics, and best practice recommendations. A written personalized care plan for preventive services as well as general preventive health recommendations were provided to patient.     Gerilyn Nestle, RN  09/27/2018  F/U with PCP 12/2018.  Reviewed documentation provided by RN and agree w/ above.  Annye Asa, MD

## 2018-09-27 ENCOUNTER — Ambulatory Visit (INDEPENDENT_AMBULATORY_CARE_PROVIDER_SITE_OTHER): Payer: Medicare Other

## 2018-09-27 ENCOUNTER — Other Ambulatory Visit: Payer: Self-pay

## 2018-09-27 VITALS — BP 136/70 | HR 49 | Ht 70.0 in | Wt 195.5 lb

## 2018-09-27 DIAGNOSIS — Z0000 Encounter for general adult medical examination without abnormal findings: Secondary | ICD-10-CM

## 2018-09-27 NOTE — Patient Instructions (Addendum)
Bring a copy of your living will and/or healthcare power of attorney to your next office visit.  Continue doing brain stimulating activities (puzzles, reading, adult coloring books, staying active) to keep memory sharp.    Fall Prevention in the Home, Adult Falls can cause injuries. They can happen to people of all ages. There are many things you can do to make your home safe and to help prevent falls. Ask for help when making these changes, if needed. What actions can I take to prevent falls? General Instructions  Use good lighting in all rooms. Replace any light bulbs that burn out.  Turn on the lights when you go into a dark area. Use night-lights.  Keep items that you use often in easy-to-reach places. Lower the shelves around your home if necessary.  Set up your furniture so you have a clear path. Avoid moving your furniture around.  Do not have throw rugs and other things on the floor that can make you trip.  Avoid walking on wet floors.  If any of your floors are uneven, fix them.  Add color or contrast paint or tape to clearly mark and help you see: ? Any grab bars or handrails. ? First and last steps of stairways. ? Where the edge of each step is.  If you use a stepladder: ? Make sure that it is fully opened. Do not climb a closed stepladder. ? Make sure that both sides of the stepladder are locked into place. ? Ask someone to hold the stepladder for you while you use it.  If there are any pets around you, be aware of where they are. What can I do in the bathroom?      Keep the floor dry. Clean up any water that spills onto the floor as soon as it happens.  Remove soap buildup in the tub or shower regularly.  Use non-skid mats or decals on the floor of the tub or shower.  Attach bath mats securely with double-sided, non-slip rug tape.  If you need to sit down in the shower, use a plastic, non-slip stool.  Install grab bars by the toilet and in the tub and  shower. Do not use towel bars as grab bars. What can I do in the bedroom?  Make sure that you have a light by your bed that is easy to reach.  Do not use any sheets or blankets that are too big for your bed. They should not hang down onto the floor.  Have a firm chair that has side arms. You can use this for support while you get dressed. What can I do in the kitchen?  Clean up any spills right away.  If you need to reach something above you, use a strong step stool that has a grab bar.  Keep electrical cords out of the way.  Do not use floor polish or wax that makes floors slippery. If you must use wax, use non-skid floor wax. What can I do with my stairs?  Do not leave any items on the stairs.  Make sure that you have a light switch at the top of the stairs and the bottom of the stairs. If you do not have them, ask someone to add them for you.  Make sure that there are handrails on both sides of the stairs, and use them. Fix handrails that are broken or loose. Make sure that handrails are as long as the stairways.  Install non-slip stair treads on all  stairs in your home.  Avoid having throw rugs at the top or bottom of the stairs. If you do have throw rugs, attach them to the floor with carpet tape.  Choose a carpet that does not hide the edge of the steps on the stairway.  Check any carpeting to make sure that it is firmly attached to the stairs. Fix any carpet that is loose or worn. What can I do on the outside of my home?  Use bright outdoor lighting.  Regularly fix the edges of walkways and driveways and fix any cracks.  Remove anything that might make you trip as you walk through a door, such as a raised step or threshold.  Trim any bushes or trees on the path to your home.  Regularly check to see if handrails are loose or broken. Make sure that both sides of any steps have handrails.  Install guardrails along the edges of any raised decks and porches.  Clear  walking paths of anything that might make someone trip, such as tools or rocks.  Have any leaves, snow, or ice cleared regularly.  Use sand or salt on walking paths during winter.  Clean up any spills in your garage right away. This includes grease or oil spills. What other actions can I take?  Wear shoes that: ? Have a low heel. Do not wear high heels. ? Have rubber bottoms. ? Are comfortable and fit you well. ? Are closed at the toe. Do not wear open-toe sandals.  Use tools that help you move around (mobility aids) if they are needed. These include: ? Canes. ? Walkers. ? Scooters. ? Crutches.  Review your medicines with your doctor. Some medicines can make you feel dizzy. This can increase your chance of falling. Ask your doctor what other things you can do to help prevent falls. Where to find more information  Centers for Disease Control and Prevention, STEAD[employer]: https://garcia.biz/  [employer] on Aging: BrainJudge.co.uk Contact a doctor if:  You are afraid of falling at home.  You feel weak, drowsy, or dizzy at home.  You fall at home. Summary  There are many simple things that you can do to make your home safe and to help prevent falls.  Ways to make your home safe include removing tripping hazards and installing grab bars in the bathroom.  Ask for help when making these changes in your home. This information is not intended to replace advice given to you by your health care provider. Make sure you discuss any questions you have with your health care provider. Document Released: 12/26/2008 Document Revised: 06/22/2018 Document Reviewed: 10/14/2016 Elsevier Patient Education  2020 [place] Maintenance, Male Adopting a healthy lifestyle and getting preventive care are important in promoting health and wellness. Ask your health care provider about:  The right schedule for you to have regular tests and exams.  Things you can do on your  own to prevent diseases and keep yourself healthy. What should I know about diet, weight, and exercise? Eat a healthy diet   Eat a diet that includes plenty of vegetables, fruits, low-fat dairy products, and lean protein.  Do not eat a lot of foods that are high in solid fats, added sugars, or sodium. Maintain a healthy weight Body mass index (BMI) is a measurement that can be used to identify possible weight problems. It estimates body fat based on height and weight. Your health care provider can help determine your BMI and help you  achieve or maintain a healthy weight. Get regular exercise Get regular exercise. This is one of the most important things you can do for your health. Most adults should:  Exercise for at least 150 minutes each week. The exercise should increase your heart rate and make you sweat (moderate-intensity exercise).  Do strengthening exercises at least twice a week. This is in addition to the moderate-intensity exercise.  Spend less time sitting. Even light physical activity can be beneficial. Watch cholesterol and blood lipids Have your blood tested for lipids and cholesterol at 77 years of age, then have this test every 5 years. You may need to have your cholesterol levels checked more often if:  Your lipid or cholesterol levels are high.  You are older than 77 years of age.  You are at high risk for heart disease. What should I know about cancer screening? Many types of cancers can be detected early and may often be prevented. Depending on your health history and family history, you may need to have cancer screening at various ages. This may include screening for:  Colorectal cancer.  Prostate cancer.  Skin cancer.  Lung cancer. What should I know about heart disease, diabetes, and high blood pressure? Blood pressure and heart disease  High blood pressure causes heart disease and increases the risk of stroke. This is more likely to develop in people  who have high blood pressure readings, are of African descent, or are overweight.  Talk with your health care provider about your target blood pressure readings.  Have your blood pressure checked: ? Every 3-5 years if you are 32-103 years of age. ? Every year if you are 82 years old or older.  If you are between the ages of 23 and 50 and are a current or former smoker, ask your health care provider if you should have a one-time screening for abdominal aortic aneurysm (AAA). Diabetes Have regular diabetes screenings. This checks your fasting blood sugar level. Have the screening done:  Once every three years after age 69 if you are at a normal weight and have a low risk for diabetes.  More often and at a younger age if you are overweight or have a high risk for diabetes. What should I know about preventing infection? Hepatitis B If you have a higher risk for hepatitis B, you should be screened for this virus. Talk with your health care provider to find out if you are at risk for hepatitis B infection. Hepatitis C Blood testing is recommended for:  Everyone born from [birth year] through 1964.  Anyone with known risk factors for hepatitis C. Sexually transmitted infections (STIs)  You should be screened each year for STIs, including gonorrhea and chlamydia, if: ? You are sexually active and are younger than 77 years of age. ? You are older than 77 years of age and your health care provider tells you that you are at risk for this type of infection. ? Your sexual activity has changed since you were last screened, and you are at increased risk for chlamydia or gonorrhea. Ask your health care provider if you are at risk.  Ask your health care provider about whether you are at high risk for HIV. Your health care provider may recommend a prescription medicine to help prevent HIV infection. If you choose to take medicine to prevent HIV, you should first get tested for HIV. You should then be tested  every 3 months for as long as you are taking the medicine.  Follow these instructions at home: Lifestyle  Do not use any products that contain nicotine or tobacco, such as cigarettes, e-cigarettes, and chewing tobacco. If you need help quitting, ask your health care provider.  Do not use street drugs.  Do not share needles.  Ask your health care provider for help if you need support or information about quitting drugs. Alcohol use  Do not drink alcohol if your health care provider tells you not to drink.  If you drink alcohol: ? Limit how much you have to 0-2 drinks a day. ? Be aware of how much alcohol is in your drink. In the [place] one drink equals one 12 oz bottle of beer (355 mL), one 5 oz glass of wine (148 mL), or one 1 oz glass of hard liquor (44 mL). General instructions  Schedule regular health, dental, and eye exams.  Stay current with your vaccines.  Tell your health care provider if: ? You often feel depressed. ? You have ever been abused or do not feel safe at home. Summary  Adopting a healthy lifestyle and getting preventive care are important in promoting health and wellness.  Follow your health care provider's instructions about healthy diet, exercising, and getting tested or screened for diseases.  Follow your health care provider's instructions on monitoring your cholesterol and blood pressure. This information is not intended to replace advice given to you by your health care provider. Make sure you discuss any questions you have with your health care provider. Document Released: 08/28/2007 Document Revised: 02/22/2018 Document Reviewed: 02/22/2018 Elsevier Patient Education  2020 [employer].

## 2019-01-01 ENCOUNTER — Encounter: Payer: Self-pay | Admitting: General Practice

## 2019-01-01 ENCOUNTER — Encounter: Payer: Self-pay | Admitting: Family Medicine

## 2019-01-01 ENCOUNTER — Ambulatory Visit (INDEPENDENT_AMBULATORY_CARE_PROVIDER_SITE_OTHER): Payer: Medicare Other | Admitting: Family Medicine

## 2019-01-01 ENCOUNTER — Other Ambulatory Visit: Payer: Self-pay

## 2019-01-01 VITALS — BP 128/74 | HR 68 | Temp 97.9°F | Resp 16 | Ht 70.0 in | Wt 196.4 lb

## 2019-01-01 DIAGNOSIS — M8580 Other specified disorders of bone density and structure, unspecified site: Secondary | ICD-10-CM | POA: Insufficient documentation

## 2019-01-01 DIAGNOSIS — E663 Overweight: Secondary | ICD-10-CM

## 2019-01-01 DIAGNOSIS — M85851 Other specified disorders of bone density and structure, right thigh: Secondary | ICD-10-CM | POA: Diagnosis not present

## 2019-01-01 DIAGNOSIS — E785 Hyperlipidemia, unspecified: Secondary | ICD-10-CM

## 2019-01-01 DIAGNOSIS — Z125 Encounter for screening for malignant neoplasm of prostate: Secondary | ICD-10-CM

## 2019-01-01 DIAGNOSIS — M85852 Other specified disorders of bone density and structure, left thigh: Secondary | ICD-10-CM

## 2019-01-01 LAB — HEPATIC FUNCTION PANEL
ALT: 20 U/L (ref 0–53)
AST: 25 U/L (ref 0–37)
Albumin: 4.5 g/dL (ref 3.5–5.2)
Alkaline Phosphatase: 58 U/L (ref 39–117)
Bilirubin, Direct: 0.2 mg/dL (ref 0.0–0.3)
Total Bilirubin: 1 mg/dL (ref 0.2–1.2)
Total Protein: 7 g/dL (ref 6.0–8.3)

## 2019-01-01 LAB — CBC WITH DIFFERENTIAL/PLATELET
Basophils Absolute: 0.1 10*3/uL (ref 0.0–0.1)
Basophils Relative: 1 % (ref 0.0–3.0)
Eosinophils Absolute: 0.2 10*3/uL (ref 0.0–0.7)
Eosinophils Relative: 3 % (ref 0.0–5.0)
HCT: 44.3 % (ref 39.0–52.0)
Hemoglobin: 15.1 g/dL (ref 13.0–17.0)
Lymphocytes Relative: 30.5 % (ref 12.0–46.0)
Lymphs Abs: 1.7 10*3/uL (ref 0.7–4.0)
MCHC: 34.2 g/dL (ref 30.0–36.0)
MCV: 89.2 fl (ref 78.0–100.0)
Monocytes Absolute: 0.6 10*3/uL (ref 0.1–1.0)
Monocytes Relative: 10.6 % (ref 3.0–12.0)
Neutro Abs: 3 10*3/uL (ref 1.4–7.7)
Neutrophils Relative %: 54.9 % (ref 43.0–77.0)
Platelets: 174 10*3/uL (ref 150.0–400.0)
RBC: 4.97 Mil/uL (ref 4.22–5.81)
RDW: 13.3 % (ref 11.5–15.5)
WBC: 5.5 10*3/uL (ref 4.0–10.5)

## 2019-01-01 LAB — LIPID PANEL
Cholesterol: 194 mg/dL (ref 0–200)
HDL: 43.3 mg/dL (ref >39.00)
LDL Cholesterol: 131 mg/dL — ABNORMAL HIGH (ref 0–99)
NonHDL: 150.42
Total CHOL/HDL Ratio: 4
Triglycerides: 96 mg/dL (ref 0.0–149.0)
VLDL: 19.2 mg/dL (ref 0.0–40.0)

## 2019-01-01 LAB — BASIC METABOLIC PANEL
BUN: 21 mg/dL (ref 6–23)
CO2: 28 mEq/L (ref 19–32)
Calcium: 9.6 mg/dL (ref 8.4–10.5)
Chloride: 102 mEq/L (ref 96–112)
Creatinine, Ser: 0.89 mg/dL (ref 0.40–1.50)
GFR: 82.72 mL/min (ref >60.00)
Glucose, Bld: 97 mg/dL (ref 70–99)
Potassium: 4.4 mEq/L (ref 3.5–5.1)
Sodium: 140 mEq/L (ref 135–145)

## 2019-01-01 LAB — VITAMIN D 25 HYDROXY (VIT D DEFICIENCY, FRACTURES): VITD: 45.02 ng/mL (ref 30.00–100.00)

## 2019-01-01 LAB — PSA, MEDICARE: PSA: 0.99 ng/ml (ref 0.10–4.00)

## 2019-01-01 LAB — TSH: TSH: 1.21 u[IU]/mL (ref 0.35–4.50)

## 2019-01-01 NOTE — Patient Instructions (Signed)
Schedule your Medicare Wellness Visit in 6 months We'll notify you of your lab results and make any changes if needed Keep up the good work on healthy diet and regular exercise- you look great! Call with any questions or concerns Stay Safe!!!

## 2019-01-01 NOTE — Progress Notes (Signed)
   Subjective:    Patient ID: Cory Barker, male    DOB: 12-May-1940, 78 y.o.   MRN: [id number]  HPI Overweight- pt's BMI is 28.18 and stable since last visit.  Pt attempts 10,000 steps daily.  Golfing regularly.  Hyperlipidemia- last LDL 133.  Not on medication.  Attempting to control w/ diet and exercise.  Denies CP, SOB, HAs, abd pain, N/V.  Osteopenia- UTD on DEXA.  On Ca and Vit D daily.  Due for Vit D level.  Is walking regularly for weight bearing exercise.  Prostate Cancer screen- due for PSA.   Review of Systems For ROS see HPI     Objective:   Physical Exam Vitals signs reviewed.  Constitutional:      General: He is not in acute distress.    Appearance: Normal appearance. He is well-developed.  HENT:     Head: Normocephalic and atraumatic.  Eyes:     Conjunctiva/sclera: Conjunctivae normal.     Pupils: Pupils are equal, round, and reactive to light.  Neck:     Musculoskeletal: Normal range of motion and neck supple.     Thyroid: No thyromegaly.  Cardiovascular:     Rate and Rhythm: Normal rate and regular rhythm.     Heart sounds: Normal heart sounds. No murmur.  Pulmonary:     Effort: Pulmonary effort is normal. No respiratory distress.     Breath sounds: Normal breath sounds.  Abdominal:     General: Bowel sounds are normal. There is no distension.     Palpations: Abdomen is soft.  Lymphadenopathy:     Cervical: No cervical adenopathy.  Skin:    General: Skin is warm and dry.  Neurological:     Mental Status: He is alert and oriented to person, place, and time.     Cranial Nerves: No cranial nerve deficit.  Psychiatric:        Mood and Affect: Mood normal.        Behavior: Behavior normal.        Thought Content: Thought content normal.           Assessment & Plan:

## 2019-01-01 NOTE — Assessment & Plan Note (Signed)
Chronic problem.  Pt is attempting to control w/ diet and exercise.  Check labs and determine if medication is needed.

## 2019-01-01 NOTE — Assessment & Plan Note (Signed)
BMI is stable.  Pt is exercising daily.  Check labs to risks stratify.  Will follow.

## 2019-01-01 NOTE — Assessment & Plan Note (Signed)
UTD on DEXA.  Check Vit D and replete prn. 

## 2019-03-28 DIAGNOSIS — D2339 Other benign neoplasm of skin of other parts of face: Secondary | ICD-10-CM | POA: Diagnosis not present

## 2019-03-28 DIAGNOSIS — L570 Actinic keratosis: Secondary | ICD-10-CM | POA: Diagnosis not present

## 2019-03-28 DIAGNOSIS — L814 Other melanin hyperpigmentation: Secondary | ICD-10-CM | POA: Diagnosis not present

## 2019-03-28 DIAGNOSIS — Z85828 Personal history of other malignant neoplasm of skin: Secondary | ICD-10-CM | POA: Diagnosis not present

## 2019-03-28 DIAGNOSIS — Z08 Encounter for follow-up examination after completed treatment for malignant neoplasm: Secondary | ICD-10-CM | POA: Diagnosis not present

## 2019-03-28 DIAGNOSIS — L218 Other seborrheic dermatitis: Secondary | ICD-10-CM | POA: Diagnosis not present

## 2019-03-28 DIAGNOSIS — L853 Xerosis cutis: Secondary | ICD-10-CM | POA: Diagnosis not present

## 2019-03-28 DIAGNOSIS — L821 Other seborrheic keratosis: Secondary | ICD-10-CM | POA: Diagnosis not present

## 2019-03-28 DIAGNOSIS — L720 Epidermal cyst: Secondary | ICD-10-CM | POA: Diagnosis not present

## 2019-03-28 DIAGNOSIS — L578 Other skin changes due to chronic exposure to nonionizing radiation: Secondary | ICD-10-CM | POA: Diagnosis not present

## 2019-05-03 DIAGNOSIS — Z23 Encounter for immunization: Secondary | ICD-10-CM | POA: Diagnosis not present

## 2019-05-28 DIAGNOSIS — Z23 Encounter for immunization: Secondary | ICD-10-CM | POA: Diagnosis not present

## 2019-07-23 ENCOUNTER — Ambulatory Visit (INDEPENDENT_AMBULATORY_CARE_PROVIDER_SITE_OTHER): Payer: Medicare Other | Admitting: Family Medicine

## 2019-07-23 ENCOUNTER — Other Ambulatory Visit: Payer: Self-pay

## 2019-07-23 ENCOUNTER — Encounter: Payer: Self-pay | Admitting: Family Medicine

## 2019-07-23 VITALS — BP 128/72 | HR 76 | Temp 97.8°F | Resp 16 | Ht 70.0 in | Wt 187.4 lb

## 2019-07-23 DIAGNOSIS — M85851 Other specified disorders of bone density and structure, right thigh: Secondary | ICD-10-CM | POA: Diagnosis not present

## 2019-07-23 DIAGNOSIS — E663 Overweight: Secondary | ICD-10-CM

## 2019-07-23 DIAGNOSIS — M85852 Other specified disorders of bone density and structure, left thigh: Secondary | ICD-10-CM

## 2019-07-23 DIAGNOSIS — E785 Hyperlipidemia, unspecified: Secondary | ICD-10-CM

## 2019-07-23 LAB — BASIC METABOLIC PANEL
BUN: 26 mg/dL — ABNORMAL HIGH (ref 6–23)
CO2: 29 mEq/L (ref 19–32)
Calcium: 9.5 mg/dL (ref 8.4–10.5)
Chloride: 102 mEq/L (ref 96–112)
Creatinine, Ser: 0.79 mg/dL (ref 0.40–1.50)
GFR: 94.78 mL/min (ref >60.00)
Glucose, Bld: 89 mg/dL (ref 70–99)
Potassium: 4.2 mEq/L (ref 3.5–5.1)
Sodium: 138 mEq/L (ref 135–145)

## 2019-07-23 LAB — CBC WITH DIFFERENTIAL/PLATELET
Basophils Absolute: 0.1 10*3/uL (ref 0.0–0.1)
Basophils Relative: 1.2 % (ref 0.0–3.0)
Eosinophils Absolute: 0.2 10*3/uL (ref 0.0–0.7)
Eosinophils Relative: 3.5 % (ref 0.0–5.0)
HCT: 42.2 % (ref 39.0–52.0)
Hemoglobin: 14.4 g/dL (ref 13.0–17.0)
Lymphocytes Relative: 32.3 % (ref 12.0–46.0)
Lymphs Abs: 1.7 10*3/uL (ref 0.7–4.0)
MCHC: 34 g/dL (ref 30.0–36.0)
MCV: 90.2 fl (ref 78.0–100.0)
Monocytes Absolute: 0.5 10*3/uL (ref 0.1–1.0)
Monocytes Relative: 9.7 % (ref 3.0–12.0)
Neutro Abs: 2.8 10*3/uL (ref 1.4–7.7)
Neutrophils Relative %: 53.3 % (ref 43.0–77.0)
Platelets: 162 10*3/uL (ref 150.0–400.0)
RBC: 4.68 Mil/uL (ref 4.22–5.81)
RDW: 13.3 % (ref 11.5–15.5)
WBC: 5.2 10*3/uL (ref 4.0–10.5)

## 2019-07-23 LAB — LIPID PANEL
Cholesterol: 187 mg/dL (ref 0–200)
HDL: 41.1 mg/dL (ref >39.00)
LDL Cholesterol: 126 mg/dL — ABNORMAL HIGH (ref 0–99)
NonHDL: 146.36
Total CHOL/HDL Ratio: 5
Triglycerides: 102 mg/dL (ref 0.0–149.0)
VLDL: 20.4 mg/dL (ref 0.0–40.0)

## 2019-07-23 LAB — HEPATIC FUNCTION PANEL
ALT: 18 U/L (ref 0–53)
AST: 21 U/L (ref 0–37)
Albumin: 4.4 g/dL (ref 3.5–5.2)
Alkaline Phosphatase: 52 U/L (ref 39–117)
Bilirubin, Direct: 0.1 mg/dL (ref 0.0–0.3)
Total Bilirubin: 0.8 mg/dL (ref 0.2–1.2)
Total Protein: 6.9 g/dL (ref 6.0–8.3)

## 2019-07-23 LAB — TSH: TSH: 1.21 u[IU]/mL (ref 0.35–4.50)

## 2019-07-23 NOTE — Assessment & Plan Note (Signed)
Pt is due for repeat DEXA scan.  Order placed.  Encouraged him to continue daily Ca and Vit D.

## 2019-07-23 NOTE — Assessment & Plan Note (Signed)
Pt is down 9 lbs since last visit.  He has cut out most sugar and is walking 5 miles 6 days a week.  Applauded his efforts.  Will follow.

## 2019-07-23 NOTE — Patient Instructions (Addendum)
Schedule your Medicare Wellness for after July 15th [facility] notify you of your lab results and make any changes if needed Keep up the good work!  You look great! We'll call you with your bone density appt Call with any questions or concerns Have a great summer!!!

## 2019-07-23 NOTE — Assessment & Plan Note (Signed)
Last LDL 131.  Pt has been dieting and exercising and is down 9 lbs since last visit.  His goal is to remain off medication.  Applauded his efforts.

## 2019-07-23 NOTE — Progress Notes (Signed)
   Subjective:    Patient ID: Cory Barker, male    DOB: 12-23-1940, 79 y.o.   MRN: [id number]  HPI Hyperlipidemia- ongoing issue.  Attempting to control w/ diet and exercise.  Last LDL 131.  Denies CP, SOB, abd pain, N/V.  Overweight- pt has lost 9 lbs since last visit.  Pt has been working on [employer] and 5 miles '6 out of 7 days'.  He has mostly cut out sugar.  Osteopenia- last DEXA 2019.  Due for repeat scan.   Review of Systems For ROS see HPI   This visit occurred during the SARS-CoV-2 public health emergency.  Safety protocols were in place, including screening questions prior to the visit, additional usage of staff PPE, and extensive cleaning of exam room while observing appropriate contact time as indicated for disinfecting solutions.       Objective:   Physical Exam Vitals reviewed.  Constitutional:      General: He is not in acute distress.    Appearance: Normal appearance. He is well-developed.  HENT:     Head: Normocephalic and atraumatic.  Eyes:     Conjunctiva/sclera: Conjunctivae normal.     Pupils: Pupils are equal, round, and reactive to light.  Neck:     Thyroid: No thyromegaly.  Cardiovascular:     Rate and Rhythm: Normal rate and regular rhythm.     Heart sounds: Normal heart sounds. No murmur.  Pulmonary:     Effort: Pulmonary effort is normal. No respiratory distress.     Breath sounds: Normal breath sounds.  Abdominal:     General: Bowel sounds are normal. There is no distension.     Palpations: Abdomen is soft.  Musculoskeletal:     Cervical back: Normal range of motion and neck supple.  Lymphadenopathy:     Cervical: No cervical adenopathy.  Skin:    General: Skin is warm and dry.  Neurological:     Mental Status: He is alert and oriented to person, place, and time.     Cranial Nerves: No cranial nerve deficit.  Psychiatric:        Behavior: Behavior normal.           Assessment & Plan:

## 2019-07-27 ENCOUNTER — Encounter: Payer: Self-pay | Admitting: General Practice

## 2019-07-30 ENCOUNTER — Telehealth: Payer: Self-pay

## 2019-07-30 NOTE — Telephone Encounter (Signed)
Chart updated to reflect 

## 2019-07-30 NOTE — Telephone Encounter (Signed)
Patient received 1st covid vaccine 05/03/19 and the 2nd covid vaccine 05/31/19 [place]  At [facility] in [place].

## 2019-08-03 ENCOUNTER — Other Ambulatory Visit: Payer: Self-pay

## 2019-08-03 ENCOUNTER — Ambulatory Visit (INDEPENDENT_AMBULATORY_CARE_PROVIDER_SITE_OTHER)
Admission: RE | Admit: 2019-08-03 | Discharge: 2019-08-03 | Disposition: A | Discharge status: Home / Self Care | Payer: Medicare Other | Source: Ambulatory Visit | Attending: Family Medicine | Admitting: Family Medicine

## 2019-08-03 DIAGNOSIS — M85851 Other specified disorders of bone density and structure, right thigh: Secondary | ICD-10-CM | POA: Diagnosis not present

## 2019-08-03 DIAGNOSIS — M85852 Other specified disorders of bone density and structure, left thigh: Secondary | ICD-10-CM | POA: Diagnosis not present

## 2019-08-04 ENCOUNTER — Encounter: Payer: Self-pay | Admitting: Family Medicine

## 2019-09-19 ENCOUNTER — Emergency Department (HOSPITAL_COMMUNITY)
Admission: EM | Admit: 2019-09-19 | Discharge: 2019-09-19 | Disposition: A | Discharge status: Home / Self Care | Payer: Medicare Other | Attending: Emergency Medicine | Admitting: Emergency Medicine

## 2019-09-19 ENCOUNTER — Encounter (HOSPITAL_COMMUNITY): Payer: Self-pay

## 2019-09-19 DIAGNOSIS — Z5321 Procedure and treatment not carried out due to patient leaving prior to being seen by health care provider: Secondary | ICD-10-CM | POA: Diagnosis not present

## 2019-09-19 DIAGNOSIS — K623 Rectal prolapse: Secondary | ICD-10-CM | POA: Insufficient documentation

## 2019-09-19 NOTE — ED Triage Notes (Signed)
PT arrives POV for eval of rectal prolapse. Pt seen at UC, found to have small rectal prolapse and sent here. Reports minimal rectal bleeding, painful to sit down. Visualized in triage, gauze in place and bleeding is controlled

## 2019-09-20 ENCOUNTER — Ambulatory Visit (INDEPENDENT_AMBULATORY_CARE_PROVIDER_SITE_OTHER): Payer: Medicare Other | Admitting: Physician Assistant

## 2019-09-20 ENCOUNTER — Encounter: Payer: Self-pay | Admitting: Physician Assistant

## 2019-09-20 ENCOUNTER — Other Ambulatory Visit: Payer: Self-pay

## 2019-09-20 VITALS — BP 128/62 | HR 55 | Temp 96.8°F | Resp 16 | Ht 70.0 in | Wt 187.8 lb

## 2019-09-20 DIAGNOSIS — K649 Unspecified hemorrhoids: Secondary | ICD-10-CM | POA: Diagnosis not present

## 2019-09-20 DIAGNOSIS — K648 Other hemorrhoids: Secondary | ICD-10-CM | POA: Diagnosis not present

## 2019-09-20 LAB — CBC WITH DIFFERENTIAL/PLATELET
Basophils Absolute: 0 10*3/uL (ref 0.0–0.1)
Basophils Relative: 0.9 % (ref 0.0–3.0)
Eosinophils Absolute: 0.2 10*3/uL (ref 0.0–0.7)
Eosinophils Relative: 3 % (ref 0.0–5.0)
HCT: 44 % (ref 39.0–52.0)
Hemoglobin: 15 g/dL (ref 13.0–17.0)
Lymphocytes Relative: 30.5 % (ref 12.0–46.0)
Lymphs Abs: 1.7 10*3/uL (ref 0.7–4.0)
MCHC: 34.2 g/dL (ref 30.0–36.0)
MCV: 89.7 fl (ref 78.0–100.0)
Monocytes Absolute: 0.5 10*3/uL (ref 0.1–1.0)
Monocytes Relative: 9.7 % (ref 3.0–12.0)
Neutro Abs: 3 10*3/uL (ref 1.4–7.7)
Neutrophils Relative %: 55.9 % (ref 43.0–77.0)
Platelets: 172 10*3/uL (ref 150.0–400.0)
RBC: 4.9 Mil/uL (ref 4.22–5.81)
RDW: 13.1 % (ref 11.5–15.5)
WBC: 5.4 10*3/uL (ref 4.0–10.5)

## 2019-09-20 LAB — COMPREHENSIVE METABOLIC PANEL
ALT: 20 U/L (ref 0–53)
AST: 23 U/L (ref 0–37)
Albumin: 4.8 g/dL (ref 3.5–5.2)
Alkaline Phosphatase: 52 U/L (ref 39–117)
BUN: 27 mg/dL — ABNORMAL HIGH (ref 6–23)
CO2: 30 mEq/L (ref 19–32)
Calcium: 9.9 mg/dL (ref 8.4–10.5)
Chloride: 103 mEq/L (ref 96–112)
Creatinine, Ser: 0.9 mg/dL (ref 0.40–1.50)
GFR: 81.51 mL/min (ref >60.00)
Glucose, Bld: 111 mg/dL — ABNORMAL HIGH (ref 70–99)
Potassium: 4.5 mEq/L (ref 3.5–5.1)
Sodium: 140 mEq/L (ref 135–145)
Total Bilirubin: 1 mg/dL (ref 0.2–1.2)
Total Protein: 7.3 g/dL (ref 6.0–8.3)

## 2019-09-20 NOTE — Progress Notes (Signed)
Patient presents to clinic today as a walk-in with complaints of bright red blood in his stool.  Initially noted this yesterday, noting a significant amount of bleeding.  As such went to a local urgent care (he is unsure which but believes it was [facility] urgent care) and was told that he had prolapse of his rectum and instructed to be seen in the emergency room.  Patient notes he did go to the emergency room but sat for several hours in which time symptoms seem to resolve.  As such he went home.  Since then has noted small amount of bright red blood with wiping.  Denies melena or true hematochezia.  Does note prior history of hemorrhoid but denies any history of rectal prolapse.  Is followed by gastroenterology but has not seen them since time of last colonoscopy in 2016 --due for repeat this year.  Patient denies abdominal pain, nausea or vomiting.  Denies tenesmus.  Denies any anorectal pain at present.  Denies any shortness of breath, lightheadedness or dizziness.  Past Medical History:  Diagnosis Date  . Allergy   . Arthritis   . Cancer ([place])    skin cancers  . Colon polyps   . Diverticulosis   . Hemorrhoids   . Hyperlipidemia   . Osteopenia   . Osteoporosis     Current Outpatient Medications on File Prior to Visit  Medication Sig Dispense Refill  . Calcium Carb-Cholecalciferol (CALCIUM 1000 + D PO) Take 1 tablet by mouth daily.    . Cyanocobalamin (VITAMIN B-12 PO) Take 1,500 mcg by mouth.    . cyclobenzaprine (FLEXERIL) 5 MG tablet Take 5 mg by mouth 3 (three) times daily as needed for muscle spasms.    . Fluocinolone Acetonide 0.01 % OIL Apply topically.     . gabapentin (NEURONTIN) 300 MG capsule Take 300 mg by mouth 3 (three) times daily.    Marland Kitchen glucosamine-chondroitin 500-400 MG tablet Take 1 tablet by mouth daily.    . meloxicam (MOBIC) 15 MG tablet Take 1 tablet (15 mg total) by mouth daily. 30 tablet 6  . Multiple Vitamin (MULTIVITAMIN) tablet Take 1 tablet by mouth  daily.     No current facility-administered medications on file prior to visit.    Allergies  Allergen Reactions  . Penicillins Rash    Family History  Problem Relation Age of Onset  . Heart attack Mother   . Lung cancer Father 69  . Breast cancer Sister   . Colon cancer Neg Hx     Social History   Socioeconomic History  . Marital status: Married    Spouse name: Not on file  . Number of children: 2  . Years of education: Not on file  . Highest education level: Not on file  Occupational History  . Occupation: Retired  Tobacco Use  . Smoking status: Former Smoker    Quit date: 03/16/1971    Years since quitting: 48.5  . Smokeless tobacco: Never Used  Vaping Use  . Vaping Use: Never used  Substance and Sexual Activity  . Alcohol use: Yes    Alcohol/week: 0.0 standard drinks    Comment: rarely  . Drug use: No  . Sexual activity: Not Currently  Other Topics Concern  . Not on file  Social History Narrative  . Not on file   Social Determinants of Health   Financial Resource Strain:   . Difficulty of Paying Living Expenses:   Food Insecurity:   . Worried About  Running Out of Food in the Last Year:   . [place] in the Last Year:   Transportation Needs:   . Lack of Transportation (Medical):   Marland Kitchen Lack of Transportation (Non-Medical):   Physical Activity:   . Days of Exercise per Week:   . Minutes of Exercise per Session:   Stress:   . Feeling of Stress :   Social Connections:   . Frequency of Communication with Friends and Family:   . Frequency of Social Gatherings with Friends and Family:   . Attends Religious Services:   . Active Member of Clubs or Organizations:   . Attends Archivist Meetings:   Marland Kitchen Marital Status:    Review of Systems - See HPI.  All other ROS are negative.  There were no vitals taken for this visit.  Physical Exam Vitals reviewed. Exam conducted with a chaperone present.  Constitutional:      Appearance: Normal  appearance.  HENT:     Head: Normocephalic and atraumatic.     Mouth/Throat:     Mouth: Mucous membranes are moist.  Eyes:     Conjunctiva/sclera: Conjunctivae normal.     Pupils: Pupils are equal, round, and reactive to light.  Cardiovascular:     Rate and Rhythm: Normal rate and regular rhythm.     Heart sounds: Normal heart sounds.  Pulmonary:     Effort: Pulmonary effort is normal.     Breath sounds: Normal breath sounds.  Genitourinary:    Rectum: External hemorrhoid and internal hemorrhoid present. No mass, tenderness or anal fissure. Normal anal tone.  Musculoskeletal:     Cervical back: Neck supple.  Neurological:     General: No focal deficit present.     Mental Status: He is alert and oriented to person, place, and time.  Psychiatric:        Mood and Affect: Mood normal.     Recent Results (from the past 2160 hour(s))  Lipid panel     Status: Abnormal   Collection Time: 07/23/19  9:09 AM  Result Value Ref Range   Cholesterol 187 0 - 200 mg/dL    Comment: ATP III Classification       Desirable:  < 200 mg/dL               Borderline High:  200 - 239 mg/dL          High:  > = 240 mg/dL   Triglycerides 102.0 0 - 149 mg/dL    Comment: Normal:  <150 mg/dLBorderline High:  150 - 199 mg/dL   HDL 41.10 >39.00 mg/dL   VLDL 20.4 0.0 - 40.0 mg/dL   LDL Cholesterol 126 (H) 0 - 99 mg/dL   Total CHOL/HDL Ratio 5     Comment:                Men          Women1/2 Average Risk     3.4          3.3Average Risk          5.0          4.42X Average Risk          9.6          7.13X Average Risk          15.0          11.0  NonHDL 146.36     Comment: NOTE:  Non-HDL goal should be 30 mg/dL higher than patient's LDL goal (i.e. LDL goal of < 70 mg/dL, would have non-HDL goal of < 100 mg/dL)  Basic metabolic panel     Status: Abnormal   Collection Time: 07/23/19  9:09 AM  Result Value Ref Range   Sodium 138 135 - 145 mEq/L   Potassium 4.2 3.5 - 5.1 mEq/L   Chloride  102 96 - 112 mEq/L   CO2 29 19 - 32 mEq/L   Glucose, Bld 89 70 - 99 mg/dL   BUN 26 (H) 6 - 23 mg/dL   Creatinine, Ser 0.79 0.40 - 1.50 mg/dL   GFR 94.78 >60.00 mL/min   Calcium 9.5 8.4 - 10.5 mg/dL  TSH     Status: None   Collection Time: 07/23/19  9:09 AM  Result Value Ref Range   TSH 1.21 0.35 - 4.50 uIU/mL  Hepatic function panel     Status: None   Collection Time: 07/23/19  9:09 AM  Result Value Ref Range   Total Bilirubin 0.8 0.2 - 1.2 mg/dL   Bilirubin, Direct 0.1 0.0 - 0.3 mg/dL   Alkaline Phosphatase 52 39 - 117 U/L   AST 21 0 - 37 U/L   ALT 18 0 - 53 U/L   Total Protein 6.9 6.0 - 8.3 g/dL   Albumin 4.4 3.5 - 5.2 g/dL  CBC with Differential/Platelet     Status: None   Collection Time: 07/23/19  9:09 AM  Result Value Ref Range   WBC 5.2 4.0 - 10.5 K/uL   RBC 4.68 4.22 - 5.81 Mil/uL   Hemoglobin 14.4 13.0 - 17.0 g/dL   HCT 42.2 39 - 52 %   MCV 90.2 78.0 - 100.0 fl   MCHC 34.0 30.0 - 36.0 g/dL   RDW 13.3 11.5 - 15.5 %   Platelets 162.0 150 - 400 K/uL   Neutrophils Relative % 53.3 43 - 77 %   Lymphocytes Relative 32.3 12 - 46 %   Monocytes Relative 9.7 3 - 12 %   Eosinophils Relative 3.5 0 - 5 %   Basophils Relative 1.2 0 - 3 %   Neutro Abs 2.8 1.4 - 7.7 K/uL   Lymphs Abs 1.7 0.7 - 4.0 K/uL   Monocytes Absolute 0.5 0 - 1 K/uL   Eosinophils Absolute 0.2 0 - 0 K/uL   Basophils Absolute 0.1 0 - 0 K/uL    Assessment/Plan: 1. Prolapsed hemorrhoids 2. Bleeding hemorrhoids No sign of rectal prolapse on examination today.  Unclear if there was any true prolapse yesterday or if it was just prolapse of internal hemorrhoid.  Unable to get records at present for review.  No active bleeding on examination.  Will place urgent referral to colorectal surgery given the amount of bleeding he had before as hemorrhoids need surgical removal.  Will check CBC and CMP today.  Vitals are stable.  Examination otherwise unremarkable.  No evidence of thrombosis.  Home care measures reviewed  with patient.  He is to start stool softener to help prevent straining with bowel movement.  If unable to see specialist within the next few days will start rectal steroids.  Strict ER precautions reviewed with patient who voiced understanding and agreement with plan.  If significant bleeding recurs he is to be seen in the emergency room immediately. - Ambulatory referral to Colorectal Surgery - CBC w/Diff - Comp Met (CMET)  This visit occurred during the SARS-CoV-2  public health emergency.  Safety protocols were in place, including screening questions prior to the visit, additional usage of staff PPE, and extensive cleaning of exam room while observing appropriate contact time as indicated for disinfecting solutions.    Leeanne Rio, PA-C

## 2019-09-20 NOTE — Patient Instructions (Signed)
Please go to the lab today for blood work.  I will call you with your results. We will alter treatment regimen(s) if indicated by your results.   I am working on getting you in with colorectal surgery ASAP.   If active bleeding recurs or you note any lightheadedness or dizziness, please be seen in the ER ASAP

## 2019-10-01 ENCOUNTER — Other Ambulatory Visit: Payer: Self-pay

## 2019-10-01 ENCOUNTER — Ambulatory Visit (INDEPENDENT_AMBULATORY_CARE_PROVIDER_SITE_OTHER): Payer: Medicare Other

## 2019-10-01 VITALS — BP 148/78 | HR 54 | Temp 97.9°F | Resp 16 | Ht 70.0 in | Wt 191.8 lb

## 2019-10-01 DIAGNOSIS — Z0000 Encounter for general adult medical examination without abnormal findings: Secondary | ICD-10-CM

## 2019-10-01 NOTE — Patient Instructions (Signed)
Cory Barker , Thank you for taking time to come for your Medicare Wellness Visit. I appreciate your ongoing commitment to your health goals. Please review the following plan we discussed and let me know if I can assist you in the future.   Screening recommendations/referrals: Colonoscopy: Completed 12/17/2014-Due 12/17/2019-Discuss with GI Recommended yearly ophthalmology/optometry visit for glaucoma screening and checkup Recommended yearly dental visit for hygiene and checkup  Vaccinations: Influenza vaccine: Due 11/2019 Pneumococcal vaccine: Completed Pneumovax 23-Due for Prevnar 23-Discuss with PCP Tdap vaccine: Discuss with pharmacy Shingles vaccine: Discuss with pharmacy   Covid-19: Completed vaccines  Advanced directives: Discussed. Please bring a copy for your chart if you complete.  Conditions/risks identified: See problem list  Next appointment: Follow up in one year for your annual wellness visit.   Preventive Care 28 Years and Older, Male Preventive care refers to lifestyle choices and visits with your health care provider that can promote health and wellness. What does preventive care include?  A yearly physical exam. This is also called an annual well check.  Dental exams once or twice a year.  Routine eye exams. Ask your health care provider how often you should have your eyes checked.  Personal lifestyle choices, including:  Daily care of your teeth and gums.  Regular physical activity.  Eating a healthy diet.  Avoiding tobacco and drug use.  Limiting alcohol use.  Practicing safe sex.  Taking low doses of aspirin every day.  Taking vitamin and mineral supplements as recommended by your health care provider. What happens during an annual well check? The services and screenings done by your health care provider during your annual well check will depend on your age, overall health, lifestyle risk factors, and family history of disease. Counseling  Your health  care provider may ask you questions about your:  Alcohol use.  Tobacco use.  Drug use.  Emotional well-being.  Home and relationship well-being.  Sexual activity.  Eating habits.  History of falls.  Memory and ability to understand (cognition).  Work and work Statistician. Screening  You may have the following tests or measurements:  Height, weight, and BMI.  Blood pressure.  Lipid and cholesterol levels. These may be checked every 5 years, or more frequently if you are over 63 years old.  Skin check.  Lung cancer screening. You may have this screening every year starting at age 35 if you have a 30-pack-year history of smoking and currently smoke or have quit within the past 15 years.  Fecal occult blood test (FOBT) of the stool. You may have this test every year starting at age 77.  Flexible sigmoidoscopy or colonoscopy. You may have a sigmoidoscopy every 5 years or a colonoscopy every 10 years starting at age 55.  Prostate cancer screening. Recommendations will vary depending on your family history and other risks.  Hepatitis C blood test.  Hepatitis B blood test.  Sexually transmitted disease (STD) testing.  Diabetes screening. This is done by checking your blood sugar (glucose) after you have not eaten for a while (fasting). You may have this done every 1-3 years.  Abdominal aortic aneurysm (AAA) screening. You may need this if you are a current or former smoker.  Osteoporosis. You may be screened starting at age 61 if you are at high risk. Talk with your health care provider about your test results, treatment options, and if necessary, the need for more tests. Vaccines  Your health care provider may recommend certain vaccines, such as:  Influenza vaccine.  This is recommended every year.  Tetanus, diphtheria, and acellular pertussis (Tdap, Td) vaccine. You may need a Td booster every 10 years.  Zoster vaccine. You may need this after age  40.  Pneumococcal 13-valent conjugate (PCV13) vaccine. One dose is recommended after age 73.  Pneumococcal polysaccharide (PPSV23) vaccine. One dose is recommended after age 31. Talk to your health care provider about which screenings and vaccines you need and how often you need them. This information is not intended to replace advice given to you by your health care provider. Make sure you discuss any questions you have with your health care provider. Document Released: 03/28/2015 Document Revised: 11/19/2015 Document Reviewed: 12/31/2014 Elsevier Interactive Patient Education  2017 [place] Prevention in the Home Falls can cause injuries. They can happen to people of all ages. There are many things you can do to make your home safe and to help prevent falls. What can I do on the outside of my home?  Regularly fix the edges of walkways and driveways and fix any cracks.  Remove anything that might make you trip as you walk through a door, such as a raised step or threshold.  Trim any bushes or trees on the path to your home.  Use bright outdoor lighting.  Clear any walking paths of anything that might make someone trip, such as rocks or tools.  Regularly check to see if handrails are loose or broken. Make sure that both sides of any steps have handrails.  Any raised decks and porches should have guardrails on the edges.  Have any leaves, snow, or ice cleared regularly.  Use sand or salt on walking paths during winter.  Clean up any spills in your garage right away. This includes oil or grease spills. What can I do in the bathroom?  Use night lights.  Install grab bars by the toilet and in the tub and shower. Do not use towel bars as grab bars.  Use non-skid mats or decals in the tub or shower.  If you need to sit down in the shower, use a plastic, non-slip stool.  Keep the floor dry. Clean up any water that spills on the floor as soon as it happens.  Remove  soap buildup in the tub or shower regularly.  Attach bath mats securely with double-sided non-slip rug tape.  Do not have throw rugs and other things on the floor that can make you trip. What can I do in the bedroom?  Use night lights.  Make sure that you have a light by your bed that is easy to reach.  Do not use any sheets or blankets that are too big for your bed. They should not hang down onto the floor.  Have a firm chair that has side arms. You can use this for support while you get dressed.  Do not have throw rugs and other things on the floor that can make you trip. What can I do in the kitchen?  Clean up any spills right away.  Avoid walking on wet floors.  Keep items that you use a lot in easy-to-reach places.  If you need to reach something above you, use a strong step stool that has a grab bar.  Keep electrical cords out of the way.  Do not use floor polish or wax that makes floors slippery. If you must use wax, use non-skid floor wax.  Do not have throw rugs and other things on the floor that can make  you trip. What can I do with my stairs?  Do not leave any items on the stairs.  Make sure that there are handrails on both sides of the stairs and use them. Fix handrails that are broken or loose. Make sure that handrails are as long as the stairways.  Check any carpeting to make sure that it is firmly attached to the stairs. Fix any carpet that is loose or worn.  Avoid having throw rugs at the top or bottom of the stairs. If you do have throw rugs, attach them to the floor with carpet tape.  Make sure that you have a light switch at the top of the stairs and the bottom of the stairs. If you do not have them, ask someone to add them for you. What else can I do to help prevent falls?  Wear shoes that:  Do not have high heels.  Have rubber bottoms.  Are comfortable and fit you well.  Are closed at the toe. Do not wear sandals.  If you use a  stepladder:  Make sure that it is fully opened. Do not climb a closed stepladder.  Make sure that both sides of the stepladder are locked into place.  Ask someone to hold it for you, if possible.  Clearly mark and make sure that you can see:  Any grab bars or handrails.  First and last steps.  Where the edge of each step is.  Use tools that help you move around (mobility aids) if they are needed. These include:  Canes.  Walkers.  Scooters.  Crutches.  Turn on the lights when you go into a dark area. Replace any light bulbs as soon as they burn out.  Set up your furniture so you have a clear path. Avoid moving your furniture around.  If any of your floors are uneven, fix them.  If there are any pets around you, be aware of where they are.  Review your medicines with your doctor. Some medicines can make you feel dizzy. This can increase your chance of falling. Ask your doctor what other things that you can do to help prevent falls. This information is not intended to replace advice given to you by your health care provider. Make sure you discuss any questions you have with your health care provider. Document Released: 12/26/2008 Document Revised: 08/07/2015 Document Reviewed: 04/05/2014 Elsevier Interactive Patient Education  2017 [employer].

## 2019-10-01 NOTE — Progress Notes (Signed)
Subjective:   Cory Barker is a 79 y.o. male who presents for Medicare Annual/Subsequent preventive examination.  Review of Systems     Cardiac Risk Factors include: advanced age (>25men, >110 women);male gender     Objective:    Today's Vitals   10/01/19 0802 10/01/19 0849  BP: (!) 150/78 (!) 148/78  Pulse: (!) 54   Resp: 16   Temp: 97.9 F (36.6 C)   TempSrc: Oral   SpO2: 96%   Weight: 191 lb 12.8 oz (87 kg)   Height: 5\' 10"  (1.778 m)    Body mass index is 27.52 kg/m.  Advanced Directives 10/01/2019 09/27/2018 09/21/2017 12/17/2014 12/03/2014  Does Patient Have a Medical Advance Directive? No No No No No  Would patient like information on creating a medical advance directive? Yes (MAU/Ambulatory/Procedural Areas - Information given) Yes (MAU/Ambulatory/Procedural Areas - Information given) Yes (MAU/Ambulatory/Procedural Areas - Information given) No - patient declined information -    Current Medications (verified) Outpatient Encounter Medications as of 10/01/2019  Medication Sig  . Calcium Carb-Cholecalciferol (CALCIUM 1000 + D PO) Take 1 tablet by mouth daily.  . Cyanocobalamin (VITAMIN B-12 PO) Take 1,500 mcg by mouth.  . cyclobenzaprine (FLEXERIL) 5 MG tablet Take 5 mg by mouth 3 (three) times daily as needed for muscle spasms.  Marland Kitchen gabapentin (NEURONTIN) 300 MG capsule Take 300 mg by mouth 3 (three) times daily.   Marland Kitchen glucosamine-chondroitin 500-400 MG tablet Take 1 tablet by mouth daily.  . meloxicam (MOBIC) 15 MG tablet Take 1 tablet (15 mg total) by mouth daily.  . Multiple Vitamin (MULTIVITAMIN) tablet Take 1 tablet by mouth daily.  . Fluocinolone Acetonide 0.01 % OIL Apply topically.    No facility-administered encounter medications on file as of 10/01/2019.    Allergies (verified) Penicillins   History: Past Medical History:  Diagnosis Date  . Allergy   . Arthritis   . Cancer ([place])    skin cancers  . Colon polyps   . Diverticulosis   . Hemorrhoids   .  Hyperlipidemia   . Osteopenia   . Osteoporosis    Past Surgical History:  Procedure Laterality Date  . COLONOSCOPY    . KNEE SURGERY     bilateral  . TONSILLECTOMY     Family History  Problem Relation Age of Onset  . Heart attack Mother   . Lung cancer Father 62  . Breast cancer Sister   . Colon cancer Neg Hx    Social History   Socioeconomic History  . Marital status: Married    Spouse name: Not on file  . Number of children: 2  . Years of education: Not on file  . Highest education level: Not on file  Occupational History  . Occupation: Retired  Tobacco Use  . Smoking status: Former Smoker    Quit date: 03/16/1971    Years since quitting: 48.5  . Smokeless tobacco: Never Used  Vaping Use  . Vaping Use: Never used  Substance and Sexual Activity  . Alcohol use: Yes    Alcohol/week: 0.0 standard drinks    Comment: rarely  . Drug use: No  . Sexual activity: Not Currently  Other Topics Concern  . Not on file  Social History Narrative  . Not on file   Social Determinants of Health   Financial Resource Strain: Low Risk   . Difficulty of Paying Living Expenses: Not hard at all  Food Insecurity: No Food Insecurity  . Worried About Charity fundraiser  in the Last Year: Never true  . Ran Out of Food in the Last Year: Never true  Transportation Needs: No Transportation Needs  . Lack of Transportation (Medical): No  . Lack of Transportation (Non-Medical): No  Physical Activity: Sufficiently Active  . Days of Exercise per Week: 7 days  . Minutes of Exercise per Session: 60 min  Stress: No Stress Concern Present  . Feeling of Stress : Not at all  Social Connections: Socially Integrated  . Frequency of Communication with Friends and Family: More than three times a week  . Frequency of Social Gatherings with Friends and Family: More than three times a week  . Attends Religious Services: More than 4 times per year  . Active Member of Clubs or Organizations: Yes  .  Attends Archivist Meetings: More than 4 times per year  . Marital Status: Married    Tobacco Counseling Counseling given: Not Answered   Clinical Intake:  Pre-visit preparation completed: Yes  Pain : No/denies pain     Nutritional Status: BMI 25 -29 Overweight Nutritional Risks: None Diabetes: No  How often do you need to have someone help you when you read instructions, pamphlets, or other written materials from your doctor or pharmacy?: 1 - Never What is the last grade level you completed in school?: Master's  Degree  Diabetic?No  Interpreter Needed?: No  Information entered by :: Caroleen Hamman LPN   Activities of Daily Living In your present state of health, do you have any difficulty performing the following activities: 10/01/2019 07/23/2019  Hearing? N N  Vision? N N  Difficulty concentrating or making decisions? N N  Walking or climbing stairs? N N  Dressing or bathing? N N  Doing errands, shopping? N N  Preparing Food and eating ? N -  Using the Toilet? N -  In the past six months, have you accidently leaked urine? N -  Do you have problems with loss of bowel control? N -  Managing your Medications? N -  Managing your Finances? N -  Housekeeping or managing your Housekeeping? N -  Some recent data might be hidden    Patient Care Team: Midge Minium, MD as PCP - General (Family Medicine) Irene Shipper, MD as Consulting Physician (Gastroenterology) [facility] [place], Tamala Fothergill, DPM as Consulting Physician (Podiatry)  Indicate any recent Medical Services you may have received from other than [facility] providers in the past year (date may be approximate).     Assessment:   This is a routine wellness examination for Cory Barker.  Hearing/Vision screen  Hearing Screening   125Hz  250Hz  500Hz  1000Hz  2000Hz  3000Hz  4000Hz  6000Hz  8000Hz   Right ear:           Left ear:           Comments: No issues  Vision Screening Comments: Last eye exam  2019  Dietary issues and exercise activities discussed: Current Exercise Habits: Home exercise routine, Type of exercise: walking, Time (Minutes): 60, Frequency (Times/Week): 7, Weekly Exercise (Minutes/Week): 420, Intensity: Mild  Goals      Patient Stated   .  Weight (lb) < 187 lb (84.8 kg) (pt-stated)      Lose weight by increasing activity and decreasing sugar intake.       Other   .  Patient Stated      Continue current exercise regimen    .  Weight (lb) < 195 lb (88.5 kg)      Lose weight.  Depression Screen PHQ 2/9 Scores 10/01/2019 07/23/2019 01/01/2019 09/27/2018 07/17/2018 12/19/2017 09/21/2017  PHQ - 2 Score 0 0 0 0 0 0 0  PHQ- 9 Score - 0 0 - - 0 -    Fall Risk Fall Risk  10/01/2019 07/23/2019 01/01/2019 09/27/2018 07/17/2018  Falls in the past year? 0 0 0 1 0  Comment - - - leaning over too far at golf course -  Number falls in past yr: 0 0 0 1 0  Injury with Fall? 0 0 0 0 0  Risk for fall due to : No Fall Risks - - - -  Follow up - Falls evaluation completed Falls evaluation completed Falls prevention discussed -    Any stairs in or around the home? Yes  If so, are there any without handrails? No  Home free of loose throw rugs in walkways, pet beds, electrical cords, etc? Yes  Adequate lighting in your home to reduce risk of falls? Yes   ASSISTIVE DEVICES UTILIZED TO PREVENT FALLS:  Life alert? No  Use of a cane, walker or w/c? No  Grab bars in the bathroom? Yes  Shower chair or bench in shower? No  Elevated toilet seat or a handicapped toilet? No   TIMED UP AND GO:  Was the test performed? Yes .  Length of time to ambulate 10 feet: 10 sec.   Gait slow and steady without use of assistive device  Cognitive Function: No cognitive impairment noted MMSE - Mini Mental State Exam 09/27/2018 09/21/2017  Orientation to time 5 5  Orientation to Place 5 5  Registration 3 3  Attention/ Calculation 5 5  Recall 1 0  Language- name 2 objects 2 2  Language-  repeat 1 1  Language- follow 3 step command 3 3  Language- read & follow direction 1 1  Write a sentence 1 1  Copy design 1 1  Total score 28 27        Immunizations Immunization History  Administered Date(s) Administered  . Moderna SARS-COVID-2 Vaccination 05/03/2019, 05/31/2019  . Pneumococcal Polysaccharide-23 11/23/2006    TDAP status: Due, Education has been provided regarding the importance of this vaccine. Advised may receive this vaccine at local pharmacy or Health Dept. Aware to provide a copy of the vaccination record if obtained from local pharmacy or Health Dept. Verbalized acceptance and understanding.   Flu Vaccine status: Up to date   Pneumococcal vaccine status: Pneumovax-23 completed. Due for Prevnar-13-Discuss with PCP at next office visit  Covid-19 vaccine status: Completed vaccines  Qualifies for Shingles Vaccine? Yes   Zostavax completed No   Shingrix Completed?: No.    Education has been provided regarding the importance of this vaccine. Patient has been advised to call insurance company to determine out of pocket expense if they have not yet received this vaccine. Advised may also receive vaccine at local pharmacy or Health Dept. Verbalized acceptance and understanding.  Screening Tests Health Maintenance  Topic Date Due  . Hepatitis C Screening  Never done  . TETANUS/TDAP  Never done  . PNA vac Low Risk Adult (2 of 2 - PCV13) 11/23/2007  . INFLUENZA VACCINE  10/14/2019  . COLONOSCOPY  12/17/2019  . COVID-19 Vaccine  Completed    Health Maintenance  Health Maintenance Due  Topic Date Due  . Hepatitis C Screening  Never done  . TETANUS/TDAP  Never done  . PNA vac Low Risk Adult (2 of 2 - PCV13) 11/23/2007    Colorectal cancer screening:  Completed 12/17/2014. Repeat every 5 years  Lung Cancer Screening: (Low Dose CT Chest recommended if Age 102-80 years, 30 pack-year currently smoking OR have quit w/in 15years.) does not qualify.   Additional  Screening:  Hepatitis C Screening:Discuss with PCO  Vision Screening: Recommended annual ophthalmology exams for early detection of glaucoma and other disorders of the eye. Is the patient up to date with their annual eye exam?  No  Who is the provider or what is the name of the office in which the patient attends annual eye exams? Unsure of name   Dental Screening: Recommended annual dental exams for proper oral hygiene  Community Resource Referral / Chronic Care Management: CRR required this visit?  No   CCM required this visit?  No      Plan:     I have personally reviewed and noted the following in the patient's chart:   . Medical and social history . Use of alcohol, tobacco or illicit drugs  . Current medications and supplements . Functional ability and status . Nutritional status . Physical activity . Advanced directives . List of other physicians . Hospitalizations, surgeries, and ER visits in previous 12 months . Vitals . Screenings to include cognitive, depression, and falls . Referrals and appointments  In addition, I have reviewed and discussed with patient certain preventive protocols, quality metrics, and best practice recommendations. A written personalized care plan for preventive services as well as general preventive health recommendations were provided to patient.     Marta Antu, LPN   [id number]  Nurse Health Advisor  Nurse Notes: None

## 2019-10-02 DIAGNOSIS — K641 Second degree hemorrhoids: Secondary | ICD-10-CM | POA: Diagnosis not present

## 2019-10-03 ENCOUNTER — Ambulatory Visit: Payer: Medicare Other

## 2019-11-06 DIAGNOSIS — K641 Second degree hemorrhoids: Secondary | ICD-10-CM | POA: Diagnosis not present

## 2019-12-01 ENCOUNTER — Other Ambulatory Visit: Payer: Self-pay

## 2019-12-01 ENCOUNTER — Other Ambulatory Visit: Payer: Medicare Other

## 2019-12-01 DIAGNOSIS — Z20822 Contact with and (suspected) exposure to covid-19: Secondary | ICD-10-CM

## 2019-12-03 LAB — NOVEL CORONAVIRUS, NAA: SARS-CoV-2, NAA: NOT DETECTED

## 2019-12-03 LAB — SARS-COV-2, NAA 2 DAY TAT

## 2020-02-28 DIAGNOSIS — Z23 Encounter for immunization: Secondary | ICD-10-CM | POA: Diagnosis not present

## 2020-04-22 ENCOUNTER — Encounter: Payer: Self-pay | Admitting: Internal Medicine

## 2020-09-10 ENCOUNTER — Encounter: Payer: Self-pay | Admitting: *Deleted

## 2020-12-31 ENCOUNTER — Telehealth: Payer: Self-pay

## 2020-12-31 ENCOUNTER — Encounter: Payer: Self-pay | Admitting: Family Medicine

## 2020-12-31 ENCOUNTER — Other Ambulatory Visit: Payer: Self-pay

## 2020-12-31 ENCOUNTER — Ambulatory Visit (INDEPENDENT_AMBULATORY_CARE_PROVIDER_SITE_OTHER): Payer: Medicare Other | Admitting: Family Medicine

## 2020-12-31 VITALS — BP 124/72 | HR 62 | Temp 97.7°F | Resp 16 | Ht 70.0 in | Wt 198.6 lb

## 2020-12-31 DIAGNOSIS — R29898 Other symptoms and signs involving the musculoskeletal system: Secondary | ICD-10-CM

## 2020-12-31 DIAGNOSIS — E785 Hyperlipidemia, unspecified: Secondary | ICD-10-CM | POA: Diagnosis not present

## 2020-12-31 DIAGNOSIS — E663 Overweight: Secondary | ICD-10-CM | POA: Diagnosis not present

## 2020-12-31 DIAGNOSIS — Z125 Encounter for screening for malignant neoplasm of prostate: Secondary | ICD-10-CM

## 2020-12-31 DIAGNOSIS — M85851 Other specified disorders of bone density and structure, right thigh: Secondary | ICD-10-CM

## 2020-12-31 DIAGNOSIS — H547 Unspecified visual loss: Secondary | ICD-10-CM

## 2020-12-31 DIAGNOSIS — M85852 Other specified disorders of bone density and structure, left thigh: Secondary | ICD-10-CM

## 2020-12-31 LAB — CBC WITH DIFFERENTIAL/PLATELET
Basophils Absolute: 0.1 10*3/uL (ref 0.0–0.1)
Basophils Relative: 0.9 % (ref 0.0–3.0)
Eosinophils Absolute: 0.2 10*3/uL (ref 0.0–0.7)
Eosinophils Relative: 3.3 % (ref 0.0–5.0)
HCT: 42.9 % (ref 39.0–52.0)
Hemoglobin: 14.5 g/dL (ref 13.0–17.0)
Lymphocytes Relative: 31.6 % (ref 12.0–46.0)
Lymphs Abs: 1.9 10*3/uL (ref 0.7–4.0)
MCHC: 33.8 g/dL (ref 30.0–36.0)
MCV: 88.8 fl (ref 78.0–100.0)
Monocytes Absolute: 0.6 10*3/uL (ref 0.1–1.0)
Monocytes Relative: 9.4 % (ref 3.0–12.0)
Neutro Abs: 3.3 10*3/uL (ref 1.4–7.7)
Neutrophils Relative %: 54.8 % (ref 43.0–77.0)
Platelets: 159 10*3/uL (ref 150.0–400.0)
RBC: 4.83 Mil/uL (ref 4.22–5.81)
RDW: 13.2 % (ref 11.5–15.5)
WBC: 5.9 10*3/uL (ref 4.0–10.5)

## 2020-12-31 LAB — LIPID PANEL
Cholesterol: 196 mg/dL (ref 0–200)
HDL: 46.8 mg/dL (ref >39.00)
LDL Cholesterol: 121 mg/dL — ABNORMAL HIGH (ref 0–99)
NonHDL: 149.64
Total CHOL/HDL Ratio: 4
Triglycerides: 142 mg/dL (ref 0.0–149.0)
VLDL: 28.4 mg/dL (ref 0.0–40.0)

## 2020-12-31 LAB — BASIC METABOLIC PANEL
BUN: 24 mg/dL — ABNORMAL HIGH (ref 6–23)
CO2: 28 mEq/L (ref 19–32)
Calcium: 10.1 mg/dL (ref 8.4–10.5)
Chloride: 102 mEq/L (ref 96–112)
Creatinine, Ser: 0.93 mg/dL (ref 0.40–1.50)
GFR: 77.96 mL/min (ref >60.00)
Glucose, Bld: 83 mg/dL (ref 70–99)
Potassium: 4.3 mEq/L (ref 3.5–5.1)
Sodium: 139 mEq/L (ref 135–145)

## 2020-12-31 LAB — HEPATIC FUNCTION PANEL
ALT: 19 U/L (ref 0–53)
AST: 22 U/L (ref 0–37)
Albumin: 4.6 g/dL (ref 3.5–5.2)
Alkaline Phosphatase: 52 U/L (ref 39–117)
Bilirubin, Direct: 0.1 mg/dL (ref 0.0–0.3)
Total Bilirubin: 0.8 mg/dL (ref 0.2–1.2)
Total Protein: 7.2 g/dL (ref 6.0–8.3)

## 2020-12-31 LAB — PSA, MEDICARE: PSA: 1.24 ng/ml (ref 0.10–4.00)

## 2020-12-31 LAB — TSH: TSH: 1.28 u[IU]/mL (ref 0.35–5.50)

## 2020-12-31 NOTE — Telephone Encounter (Signed)
Dr Birdie Riddle, I do not see a referral for patient to GI. Please advise

## 2020-12-31 NOTE — Telephone Encounter (Signed)
[place]  Reason for Referral Request: Pt was returning call regarding referrals. Pr would like to see Dianna Rizza for Therapy on his leg pt said she is located in [place]. And for Colon he would like to go to Dr Henrene Pastor in [place] he could not tell me anything else about the doctor  Has patient been seen PCP for this complaint?No   (If no,  please schedule patient for appointment for complaint.)  Patient scheduled on:   (If yes, please find out following information.)  Referral for which specialty:PT and Gastro   Preferred office/provider:Dianna Rizza Leg Therapy and Dr Henrene Pastor Colon

## 2020-12-31 NOTE — Assessment & Plan Note (Signed)
UTD on DEXA.  Taking daily Ca and Vit D.  Walking regularly.  Will continue to follow.

## 2020-12-31 NOTE — Assessment & Plan Note (Signed)
Deteriorated.  Pt has gained 6 lbs since last visit.  He is physically active but admits to eating out frequently.  Encouraged him to try and limit processed foods.  Will check labs to risk stratify.  Will follow.

## 2020-12-31 NOTE — Patient Instructions (Signed)
Follow up in 1 year or as needed We'll notify you of your lab results and make nay changes if needed We'll call you with your PT appt for a strength and balance evaluation Please schedule an eye exam at your convenience Continue to take your daily Calcium and Vit D You can use Voltaren Gel for joint pain Continue your daily Calcium and Vit D Try and make healthy food choices- limit your processed foods Call with any questions or concerns Stay Safe!  Stay Healthy!

## 2020-12-31 NOTE — Progress Notes (Addendum)
   Subjective:    Patient ID: Cory Barker, male    DOB: 1940/06/07, 80 y.o.   MRN: [id number]  HPI Hyperlipidemia- pt's last LDL was 126.  No abd pain, N/V.  Overweight- pt has gained 6 lbs since last visit.  BMI now 28.5  Pt is exercising daily- either walking or golf.  No CP, SOB, HAs, edema.  Pt reports eating out frequently.  Decreased vision- pt's eye screen showed 20/40 vision w/ 20/40 in R and 20/50 in L  Osteopenia- last DEXA 08/03/19.  UTD.  Walks ~5 miles/day.  Taking daily Ca and Vit D.  L leg weakness- sxs started ~6 months ago.  At times doesn't feel steady enough to go upstairs.  Sxs started upon return from [facility] in May.  'it just seems to be getting weaker'.  Some discomfort in lower leg.  No knee pain.  Pt reports difficulty rising from a seated position.   Review of Systems For ROS see HPI   This visit occurred during the SARS-CoV-2 public health emergency.  Safety protocols were in place, including screening questions prior to the visit, additional usage of staff PPE, and extensive cleaning of exam room while observing appropriate contact time as indicated for disinfecting solutions.      Objective:   Physical Exam Vitals reviewed.  Constitutional:      General: He is not in acute distress.    Appearance: Normal appearance. He is well-developed. He is not ill-appearing.  HENT:     Head: Normocephalic and atraumatic.  Eyes:     Extraocular Movements: Extraocular movements intact.     Conjunctiva/sclera: Conjunctivae normal.     Pupils: Pupils are equal, round, and reactive to light.  Neck:     Thyroid: No thyromegaly.  Cardiovascular:     Rate and Rhythm: Normal rate and regular rhythm.     Pulses: Normal pulses.     Heart sounds: Normal heart sounds. No murmur heard. Pulmonary:     Effort: Pulmonary effort is normal. No respiratory distress.     Breath sounds: Normal breath sounds.  Abdominal:     General: Bowel sounds are normal. There is no distension.      Palpations: Abdomen is soft.  Musculoskeletal:     Cervical back: Normal range of motion and neck supple.     Right lower leg: No edema.     Left lower leg: No edema.  Lymphadenopathy:     Cervical: No cervical adenopathy.  Skin:    General: Skin is warm and dry.  Neurological:     General: No focal deficit present.     Mental Status: He is alert and oriented to person, place, and time.     Cranial Nerves: No cranial nerve deficit.  Psychiatric:        Mood and Affect: Mood normal.        Behavior: Behavior normal.          Assessment & Plan:  Decreased vision- new.  Pt's screen revealed 20/40 vision and 20/50 in L eye.  Encouraged him to schedule an eye exam at his convenience.  Will follow  L leg weakness- denies pain but starting 6 months ago he did not feel that leg was always strong enough to support him.  Will refer to PT for strength and balance eval.  Pt expressed understanding and is in agreement w/ plan.

## 2020-12-31 NOTE — Assessment & Plan Note (Signed)
Last LDL=126.  Has never been on statin.  Attempting to control w/ diet and exercise.  Check labs.  Start meds prn

## 2021-01-01 NOTE — Telephone Encounter (Signed)
At age 80 he does not need to have a repeat colonoscopy unless he feels very strongly about this.  And at that point, it would be up to the GI doctor to decide if colonoscopy was warranted.

## 2021-01-01 NOTE — Telephone Encounter (Signed)
Called and spoke with patient about concerns of a colonoscopy. I informed patient per you that given his age he does not need to repeat the colonoscopy but it would be ultimately up to his GI doctor to make that decision. Patient understood. No further concerns at this time.

## 2021-10-09 ENCOUNTER — Encounter: Payer: Self-pay | Admitting: Family Medicine

## 2021-10-09 ENCOUNTER — Ambulatory Visit (INDEPENDENT_AMBULATORY_CARE_PROVIDER_SITE_OTHER): Payer: Medicare Other | Admitting: Family Medicine

## 2021-10-09 VITALS — BP 114/68 | HR 49 | Temp 97.6°F | Resp 16 | Ht 70.0 in | Wt 197.0 lb

## 2021-10-09 DIAGNOSIS — E663 Overweight: Secondary | ICD-10-CM | POA: Diagnosis not present

## 2021-10-09 DIAGNOSIS — M85852 Other specified disorders of bone density and structure, left thigh: Secondary | ICD-10-CM | POA: Diagnosis not present

## 2021-10-09 DIAGNOSIS — M85851 Other specified disorders of bone density and structure, right thigh: Secondary | ICD-10-CM | POA: Diagnosis not present

## 2021-10-09 DIAGNOSIS — E785 Hyperlipidemia, unspecified: Secondary | ICD-10-CM

## 2021-10-09 DIAGNOSIS — R29898 Other symptoms and signs involving the musculoskeletal system: Secondary | ICD-10-CM | POA: Insufficient documentation

## 2021-10-09 LAB — CBC WITH DIFFERENTIAL/PLATELET
Basophils Absolute: 0 10*3/uL (ref 0.0–0.1)
Basophils Relative: 1 % (ref 0.0–3.0)
Eosinophils Absolute: 0.2 10*3/uL (ref 0.0–0.7)
Eosinophils Relative: 4.1 % (ref 0.0–5.0)
HCT: 43 % (ref 39.0–52.0)
Hemoglobin: 14.3 g/dL (ref 13.0–17.0)
Lymphocytes Relative: 32.7 % (ref 12.0–46.0)
Lymphs Abs: 1.7 10*3/uL (ref 0.7–4.0)
MCHC: 33.3 g/dL (ref 30.0–36.0)
MCV: 89.9 fl (ref 78.0–100.0)
Monocytes Absolute: 0.5 10*3/uL (ref 0.1–1.0)
Monocytes Relative: 10.6 % (ref 3.0–12.0)
Neutro Abs: 2.7 10*3/uL (ref 1.4–7.7)
Neutrophils Relative %: 51.6 % (ref 43.0–77.0)
Platelets: 166 10*3/uL (ref 150.0–400.0)
RBC: 4.79 Mil/uL (ref 4.22–5.81)
RDW: 13.3 % (ref 11.5–15.5)
WBC: 5.1 10*3/uL (ref 4.0–10.5)

## 2021-10-09 LAB — LIPID PANEL
Cholesterol: 191 mg/dL (ref 0–200)
HDL: 41.4 mg/dL (ref >39.00)
LDL Cholesterol: 122 mg/dL — ABNORMAL HIGH (ref 0–99)
NonHDL: 149.56
Total CHOL/HDL Ratio: 5
Triglycerides: 139 mg/dL (ref 0.0–149.0)
VLDL: 27.8 mg/dL (ref 0.0–40.0)

## 2021-10-09 LAB — BASIC METABOLIC PANEL
BUN: 30 mg/dL — ABNORMAL HIGH (ref 6–23)
CO2: 29 mEq/L (ref 19–32)
Calcium: 9.4 mg/dL (ref 8.4–10.5)
Chloride: 104 mEq/L (ref 96–112)
Creatinine, Ser: 0.92 mg/dL (ref 0.40–1.50)
GFR: 78.55 mL/min (ref >60.00)
Glucose, Bld: 105 mg/dL — ABNORMAL HIGH (ref 70–99)
Potassium: 4.1 mEq/L (ref 3.5–5.1)
Sodium: 139 mEq/L (ref 135–145)

## 2021-10-09 LAB — VITAMIN D 25 HYDROXY (VIT D DEFICIENCY, FRACTURES): VITD: 38.86 ng/mL (ref 30.00–100.00)

## 2021-10-09 LAB — TSH: TSH: 1.13 u[IU]/mL (ref 0.35–5.50)

## 2021-10-09 LAB — HEPATIC FUNCTION PANEL
ALT: 14 U/L (ref 0–53)
AST: 19 U/L (ref 0–37)
Albumin: 4.4 g/dL (ref 3.5–5.2)
Alkaline Phosphatase: 50 U/L (ref 39–117)
Bilirubin, Direct: 0.1 mg/dL (ref 0.0–0.3)
Total Bilirubin: 0.8 mg/dL (ref 0.2–1.2)
Total Protein: 7.3 g/dL (ref 6.0–8.3)

## 2021-10-09 NOTE — Progress Notes (Signed)
   Subjective:    Patient ID: Cory Barker, male    DOB: February 03, 1941, 81 y.o.   MRN: [id number]  HPI Hyperlipidemia- chronic problem.  Attempting to control w/ diet and exercise.  Last LDL 121.  No CP, SOB, abd pain, N/V.  Overweight- pt's weight is stable and BMI 28.27.  Golfing 5x/week, taking 10k steps daily.  Osteopenia- pt due for repeat DEXA.  L leg weakness- pt notes this when going up stairs.  Denies pain.  Reports when he first returned from [facility] he 'really had to rely on the railing'.  This has gotten somewhat better after 3 months but L side still feels weak.   Review of Systems For ROS see HPI     Objective:   Physical Exam Vitals reviewed.  Constitutional:      General: He is not in acute distress.    Appearance: Normal appearance. He is well-developed. He is not ill-appearing.  HENT:     Head: Normocephalic and atraumatic.  Eyes:     Extraocular Movements: Extraocular movements intact.     Conjunctiva/sclera: Conjunctivae normal.     Pupils: Pupils are equal, round, and reactive to light.  Neck:     Thyroid: No thyromegaly.  Cardiovascular:     Rate and Rhythm: Normal rate and regular rhythm.     Pulses: Normal pulses.     Heart sounds: Normal heart sounds. No murmur heard. Pulmonary:     Effort: Pulmonary effort is normal. No respiratory distress.     Breath sounds: Normal breath sounds.  Abdominal:     General: Bowel sounds are normal. There is no distension.     Palpations: Abdomen is soft.  Musculoskeletal:     Cervical back: Normal range of motion and neck supple.     Right lower leg: No edema.     Left lower leg: No edema.  Lymphadenopathy:     Cervical: No cervical adenopathy.  Skin:    General: Skin is warm and dry.  Neurological:     General: No focal deficit present.     Mental Status: He is alert and oriented to person, place, and time.     Cranial Nerves: No cranial nerve deficit.     Motor: Weakness (very mild L hip flexor weakness when  compared to R) present.  Psychiatric:        Mood and Affect: Mood normal.        Behavior: Behavior normal.          Assessment & Plan:

## 2021-10-09 NOTE — Patient Instructions (Signed)
Follow up in 1 year or as needed We'll notify you of your lab results and make any changes if needed Keep up the good work on healthy diet and regular exercise- you look great! We'll call you to schedule bone density test (it's time to repeat!) We'll call you with your Neurology appt and Physical therapy appt to evaluate the L leg Call with any questions or concerns Stay Safe!  Stay Healthy!! Enjoy the rest of your summer!!!

## 2021-10-11 NOTE — Assessment & Plan Note (Signed)
Chronic problem.  Due for repeat DEXA.  Currently on Calcium supplement.  Check Vit D and replete prn.

## 2021-10-11 NOTE — Assessment & Plan Note (Signed)
Chronic problem.  Currently attempting to control w/ diet and exercise.  Last LDL 121.  Currently asymptomatic.  Check labs and determine if prescription meds are needed.

## 2021-10-11 NOTE — Assessment & Plan Note (Signed)
Weight is stable.  BMI 28.27  Walking and golfing regularly.  Applauded his efforts.  Will continue to follow.

## 2021-10-11 NOTE — Assessment & Plan Note (Signed)
New.  Pt reports this is most notable when going up stairs.  Finds he has to rely on using the railing to ensure leg won't give out.  States this has improved in the 3 months he has been home from [facility], but still present.  Mild hip flexor weakness on exam today.  Refer to PT for eval and tx and refer to Neuro for workup of possible underlying cause.  Pt expressed understanding and is in agreement w/ plan.

## 2021-10-12 NOTE — Progress Notes (Signed)
Pt seen results via my chart  

## 2021-10-13 ENCOUNTER — Encounter: Payer: Self-pay | Admitting: Neurology

## 2021-11-03 ENCOUNTER — Telehealth: Payer: Self-pay | Admitting: Family Medicine

## 2021-11-03 NOTE — Telephone Encounter (Signed)
Left message for patient to call back and schedule Medicare Annual Wellness Visit (AWV).   Please offer to do virtually or by telephone.  Left office number and my jabber [phone].  Last AWV:10/01/2019  Please schedule at anytime with Nurse Health Advisor.

## 2021-12-17 ENCOUNTER — Ambulatory Visit (INDEPENDENT_AMBULATORY_CARE_PROVIDER_SITE_OTHER): Payer: Medicare Other

## 2021-12-17 VITALS — Ht 70.0 in | Wt 195.0 lb

## 2021-12-17 DIAGNOSIS — Z0000 Encounter for general adult medical examination without abnormal findings: Secondary | ICD-10-CM

## 2021-12-17 NOTE — Progress Notes (Signed)
Initial neurology clinic note  SERVICE DATE: 12/22/21  Reason for Evaluation: Consultation requested by Cory Minium, MD for an opinion regarding left leg weakness. My final recommendations will be communicated back to the requesting physician by way of shared medical record or letter to requesting physician via [place] mail.  HPI: This is Mr. Cory Barker, a 81 y.o. left-handed male with a medical history of HLD, osteopenia, OA who presents to neurology clinic with the chief complaint of left leg weakness. The patient is alone today.  Patient has had left leg weakness for about the last 1.5 years. He feels like it has been very slowly progressive. He particularly has difficulty with stairs. He has tried to exercise, but has not seemed to make a big different. Activity does not change symptoms significantly. He does mention that he plays golf and tries to walk about 5 miles while doing so and will feel weaker in the leg afterward. He denies significant pain in the back. He denies significant numbness or tingling, but occasionally has tingling in the feet which he attributes to walking on his hardwood floors at home. He denies any falls in the last 3-4 years, but is more careful. He mentions he still will climb ladders. He likes to go to his garage and tinker.  He does have knee discomfort bilaterally, thought to be due to OA.  The patient has not had similar episodes of symptoms in the past.    Muscle bulk loss? Does feel like he has lost same mass around his shoulders Muscle pain? No  Cramps/Twitching? Occasional cramp in calf; denies twitching  Able to brush hair/teeth without difficulty? yes  Able to button shirts/use zips? yes  Clumsiness/dropping grasped objects? Rare, but has happened Able to get out of chair without using arms? Yes  Able to walk up steps easily? Not easily Use an assistive device to walk? No  Significant imbalance with walking? Occasionally lightheaded, some  unsteady gait since turning 81   Any change in urine color, especially after exertion/physical activity? No  The patient denies symptoms suggestive of oculobulbar weakness including ptosis, dysphagia, poor saliva control, dysarthria/dysphonia, impaired mastication, facial weakness/droop. He has occasional double vision. He has a rasp and quiet speaking voice that patient says has been present since he was a child.  There are no neuromuscular respiratory weakness symptoms, particularly orthopnea>dyspnea.   Pseudobulbar affect is absent.  The patient does not report symptoms referable to autonomic dysfunction including impaired sweating, heat or cold intolerance, excessive mucosal dryness, gastroparetic early satiety, postprandial abdominal bloating, constipation, bowel or bladder dyscontrol, or syncope/presyncope/orthostatic intolerance.  The patient has not noticed any recent skin rashes nor does he report any constitutional symptoms like fever, night sweats, anorexia or unintentional weight loss.  EtOH use: 15 cans of light beer over 2-3 weeks  Restrictive diet? No, but tries not to eat beef Family history of neuropathy/myopathy/NM disease? No  Patient saw his PCP, Dr. Birdie Riddle on 10/09/21 for left leg weakness, particularly when going up stairs. Patient referred to PT and neurology for further evaluation. Patient did not ever go to PT. He relates that he has been busy taking care of his wife.  Patient also mentions that his fingers get swollen. He wonders if this is connected.   MEDICATIONS:  Outpatient Encounter Medications as of 12/22/2021  Medication Sig   Calcium Carb-Cholecalciferol (CALCIUM 1000 + D PO) Take 1 tablet by mouth daily.   glucosamine-chondroitin 500-400 MG tablet Take 1 tablet by mouth  daily.   vitamin B-12 (CYANOCOBALAMIN) 100 MCG tablet Take 100 mcg by mouth in the morning and at bedtime.   No facility-administered encounter medications on file as of 12/22/2021.     PAST MEDICAL HISTORY: Past Medical History:  Diagnosis Date   Allergy    Arthritis    Cancer ([place])    skin cancers   Colon polyps    Diverticulosis    Hemorrhoids    Hyperlipidemia    Osteopenia    Osteoporosis     PAST SURGICAL HISTORY: Past Surgical History:  Procedure Laterality Date   COLONOSCOPY     KNEE SURGERY     bilateral   TONSILLECTOMY      ALLERGIES: Allergies  Allergen Reactions   Penicillins Rash    FAMILY HISTORY: Family History  Problem Relation Age of Onset   Heart attack Mother    Lung cancer Father 30   Breast cancer Sister    Colon cancer Neg Hx     SOCIAL HISTORY: Social History   Tobacco Use   Smoking status: Former    Types: Cigarettes    Quit date: 03/16/1971    Years since quitting: 50.8   Smokeless tobacco: Never  Vaping Use   Vaping Use: Never used  Substance Use Topics   Alcohol use: Yes    Alcohol/week: 0.0 standard drinks of alcohol    Comment: rarely   Drug use: No   Social History   Social History Narrative   Left handed    Caffeine rarely   Live in a two story home     OBJECTIVE: PHYSICAL EXAM: BP (!) 146/72   Pulse (!) 53   Ht '5\' 10"'$  (1.778 m)   Wt 198 lb (89.8 kg)   SpO2 98%   BMI 28.41 kg/m   General: General appearance: Awake and alert. No distress. Cooperative with exam.  Skin: No obvious rash or jaundice. HEENT: Atraumatic. Anicteric. Lungs: Non-labored breathing on room air  Heart: Regular Abdomen: Soft, non tender. Extremities: No edema. No obvious deformity. Tenderness in 3rd digit DIP Musculoskeletal: No obvious joint swelling. Psych: Affect appropriate.  Neurological: Mental Status: Alert. Speech fluent. No pseudobulbar affect Cranial Nerves: CNII: No RAPD. Visual fields grossly intact. CNIII, IV, VI: PERRL. No nystagmus. EOMI. Double vision with sustained upgaze. Cover/uncover test with horizontal correction bilaterally. Mildly positive Cogan lid twitch sign bilaterally. CN V:  Facial sensation intact bilaterally to fine touch. Masseter clench strong. Jaw jerk negative. CN VII: Facial muscles symmetric and strong. No ptosis at rest or after sustained upgaze. CN VIII: Hearing grossly intact bilaterally. CN IX: No hypophonia. CN X: Palate elevates symmetrically. CN XI: Full strength shoulder shrug bilaterally. CN XII: Tongue protrusion full and midline. No atrophy or fasciculations. No significant dysarthria. Mild lisp/rasp Motor: Tone is normal. No fasciculations in extremities. No atrophy.  Individual muscle group testing (MRC grade out of 5):  Movement     Neck flexion 5    Neck extension 5     Right Left   Shoulder abduction 5 5   Shoulder adduction 5 5   Shoulder ext rotation 5 5   Shoulder int rotation 5 5   Elbow flexion 5 5   Elbow extension 5 5   Wrist extension 5 5   Wrist flexion 5 5   Finger abduction - FDI 5 5   Finger abduction - ADM 5 5   Finger extension 5 5   Finger distal flexion - 2/3 5 5  Finger distal flexion - 4/'5 5 5   '$ Thumb flexion - FPL 5 5   Thumb abduction - APB 5 5    Hip flexion 5- 5-   Hip extension 5 5   Hip adduction 5 5   Hip abduction 5 5   Knee extension 5 5   Knee flexion 5 5   Dorsiflexion 5 5   Plantarflexion 5 5   Inversion 5 5   Eversion 5 5   Great toe extension 5- 5-   Great toe flexion 5 5     Reflexes:  Right Left   Bicep 1+ 1+   Tricep 1+ 1+   BrRad 1+ 1+   Knee 1+ 1+   Ankle 0 0    Pathological Reflexes: Babinski: mute response bilaterally Hoffman: absent bilaterally Troemner: absent bilaterally  Facial: Absent bilaterally Midline tap: absent Sensation: Pinprick: Intact in all extremities Vibration: Intact in bilateral upper extremities. Absent in bilateral great toes, diminished at bilateral ankles, fully present at bilateral knees Temperature: Intact in all extremities Proprioception: Intact in bilateral great toes Coordination: Intact finger-to- nose-finger bilaterally.  Romberg negative. Gait: Able to rise from chair with arms crossed unassisted. Normal, narrow-based gait. Able to tandem walk. Able to walk on toes and heels.  Lab and Test Review: Internal labs: Normal or unremarkable: vit D, CBC, LFTs, TSH BMP significant for mildly elevated glucose and BUN  MRI cervical spine wo contrast (08/15/2013): FINDINGS:  Foramen magnum is widely patent. There is ordinary osteoarthritis of  the C1-2 articulation but no encroachment upon the neural spaces.   C2-3: There is facet arthropathy on the right. No canal or foraminal  stenosis.   C3-4: There is spondylosis with endplate osteophytes and bulging of  the disc slightly more prominent towards the left. There is facet  degeneration on the left. The central canal is widely patent. There  is mild foraminal narrowing on the left.   C4-5: Endplate osteophytes and mild bulging of the disc. There is  mild facet degeneration on the left. The central canal is widely  patent. There is mild foraminal narrowing on the left.   C5-6: There is spondylosis with endplate osteophytes and there is a  broad-based disc herniation. The ventral subarachnoid space is  effaced in the cord is indented. No cord edema. There is mild facet  hypertrophy. There is foraminal stenosis bilaterally that could  affect the C6 nerve roots.   C6-7: Mild bulging of the disc.  No canal or foraminal stenosis.   C7-T1:  Normal interspace.  No stenosis.   No abnormal cord edema or other focal cord lesion.   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 Effacement of the  ventral subarachnoid space with slight indentation of the cord.  Foraminal stenosis bilaterally that could affect either or both C6  nerve roots.   Upper cervical facet arthropathy that could be a cause of neck pain.  This is most pronounced on the right at C2-3 but also noted on the  left at C3-4 and C4-5. There is mild foraminal narrowing on the left   at C3-4 and C4-5 without gross neural compression.    ASSESSMENT: Sasan Wilkie is a 81 y.o. male who presents for evaluation of left leg weakness and gait imbalance. He has a relevant medical history of HLD, OA, and osteopenia. His neurological examination is pertinent for diplopia with sustained up gaze and loss of vibration sense in bilateral feet. Notably, I did not find any objective  weakness of the left leg today.   The etiology of patient's symptoms is currently unclear and may be multifactorial. Possible considerations include weakness due to deconditioning, arthritis, or mild neuropathy. I would not expect neuropathy to cause proximal muscle problems though. Other possibilities include myopathy or myasthenia gravis. Given the normal muscle strength today, myopathy seems less likely. I will send lab work today and potentially expand work up based on those results.   PLAN: -Blood work: B12, HbA1c, IFE, AChR abs, CK -Patient encouraged to stay active, but safe. Orthostatic intolerance recommendations given as he mentioned lightheadedness with standing.  -Return to clinic in 2 months  The impression above as well as the plan as outlined below were extensively discussed with the patient who voiced understanding. All questions were answered to their satisfaction.  The patient was counseled on pertinent fall precautions per the printed material provided today, and as noted under the "Patient Instructions" section below.  When available, results of the above investigations and possible further recommendations will be communicated to the patient via telephone/MyChart. Patient to call office if not contacted after expected testing turnaround time.   Total time spent reviewing records, interview, history/exam, documentation, and coordination of care on day of encounter:  80 min   Thank you for allowing me to participate in patient's care.  If I can answer any additional questions, I would be  pleased to do so.  Kai Levins, MD   CC: Cory Minium, MD [address] [place] [address] [place] [address]  CC: Referring provider: Midge Minium, MD [address] [place] [address] [place],  [place] [address]

## 2021-12-17 NOTE — Patient Instructions (Signed)
Mr. Cory Barker , Thank you for taking time to come for your Medicare Wellness Visit. I appreciate your ongoing commitment to your health goals. Please review the following plan we discussed and let me know if I can assist you in the future.   These are the goals we discussed:  Goals       Patient Stated      Continue current exercise regimen      Weight (lb) < 187 lb (84.8 kg) (pt-stated)      Lose weight by increasing activity and decreasing sugar intake.       Weight (lb) < 195 lb (88.5 kg)      Lose weight.         This is a list of the screening recommended for you and due dates:  Health Maintenance  Topic Date Due   Flu Shot  Never done   Tetanus Vaccine  12/31/2021*   HPV Vaccine  Aged Out   Pneumonia Vaccine  Discontinued   Colon Cancer Screening  Discontinued   COVID-19 Vaccine  Discontinued   Zoster (Shingles) Vaccine  Discontinued  *Topic was postponed. The date shown is not the original due date.    Advanced directives: Advance directive discussed with you today. I have provided a copy for you to complete at home and have notarized. Once this is complete please bring a copy in to our office so we can scan it into your chart.   Conditions/risks identified: Aim for 30 minutes of exercise or brisk walking, 6-8 glasses of water, and 5 servings of fruits and vegetables each day.   Next appointment: Follow up in one year for your annual wellness visit.   Preventive Care 34 Years and Older, Male  Preventive care refers to lifestyle choices and visits with your health care provider that can promote health and wellness. What does preventive care include? A yearly physical exam. This is also called an annual well check. Dental exams once or twice a year. Routine eye exams. Ask your health care provider how often you should have your eyes checked. Personal lifestyle choices, including: Daily care of your teeth and gums. Regular physical activity. Eating a healthy  diet. Avoiding tobacco and drug use. Limiting alcohol use. Practicing safe sex. Taking low doses of aspirin every day. Taking vitamin and mineral supplements as recommended by your health care provider. What happens during an annual well check? The services and screenings done by your health care provider during your annual well check will depend on your age, overall health, lifestyle risk factors, and family history of disease. Counseling  Your health care provider may ask you questions about your: Alcohol use. Tobacco use. Drug use. Emotional well-being. Home and relationship well-being. Sexual activity. Eating habits. History of falls. Memory and ability to understand (cognition). Work and work Statistician. Screening  You may have the following tests or measurements: Height, weight, and BMI. Blood pressure. Lipid and cholesterol levels. These may be checked every 5 years, or more frequently if you are over 13 years old. Skin check. Lung cancer screening. You may have this screening every year starting at age 45 if you have a 30-pack-year history of smoking and currently smoke or have quit within the past 15 years. Fecal occult blood test (FOBT) of the stool. You may have this test every year starting at age 28. Flexible sigmoidoscopy or colonoscopy. You may have a sigmoidoscopy every 5 years or a colonoscopy every 10 years starting at age 38. Prostate cancer  screening. Recommendations will vary depending on your family history and other risks. Hepatitis C blood test. Hepatitis B blood test. Sexually transmitted disease (STD) testing. Diabetes screening. This is done by checking your blood sugar (glucose) after you have not eaten for a while (fasting). You may have this done every 1-3 years. Abdominal aortic aneurysm (AAA) screening. You may need this if you are a current or former smoker. Osteoporosis. You may be screened starting at age 80 if you are at high risk. Talk with  your health care provider about your test results, treatment options, and if necessary, the need for more tests. Vaccines  Your health care provider may recommend certain vaccines, such as: Influenza vaccine. This is recommended every year. Tetanus, diphtheria, and acellular pertussis (Tdap, Td) vaccine. You may need a Td booster every 10 years. Zoster vaccine. You may need this after age 67. Pneumococcal 13-valent conjugate (PCV13) vaccine. One dose is recommended after age 14. Pneumococcal polysaccharide (PPSV23) vaccine. One dose is recommended after age 91. Talk to your health care provider about which screenings and vaccines you need and how often you need them. This information is not intended to replace advice given to you by your health care provider. Make sure you discuss any questions you have with your health care provider. Document Released: 03/28/2015 Document Revised: 11/19/2015 Document Reviewed: 12/31/2014 Elsevier Interactive Patient Education  2017 [employer] Prevention in the Home Falls can cause injuries. They can happen to people of all ages. There are many things you can do to make your home safe and to help prevent falls. What can I do on the outside of my home? Regularly fix the edges of walkways and driveways and fix any cracks. Remove anything that might make you trip as you walk through a door, such as a raised step or threshold. Trim any bushes or trees on the path to your home. Use bright outdoor lighting. Clear any walking paths of anything that might make someone trip, such as rocks or tools. Regularly check to see if handrails are loose or broken. Make sure that both sides of any steps have handrails. Any raised decks and porches should have guardrails on the edges. Have any leaves, snow, or ice cleared regularly. Use sand or salt on walking paths during winter. Clean up any spills in your garage right away. This includes oil or grease spills. What  can I do in the bathroom? Use night lights. Install grab bars by the toilet and in the tub and shower. Do not use towel bars as grab bars. Use non-skid mats or decals in the tub or shower. If you need to sit down in the shower, use a plastic, non-slip stool. Keep the floor dry. Clean up any water that spills on the floor as soon as it happens. Remove soap buildup in the tub or shower regularly. Attach bath mats securely with double-sided non-slip rug tape. Do not have throw rugs and other things on the floor that can make you trip. What can I do in the bedroom? Use night lights. Make sure that you have a light by your bed that is easy to reach. Do not use any sheets or blankets that are too big for your bed. They should not hang down onto the floor. Have a firm chair that has side arms. You can use this for support while you get dressed. Do not have throw rugs and other things on the floor that can make you trip. What can  I do in the kitchen? Clean up any spills right away. Avoid walking on wet floors. Keep items that you use a lot in easy-to-reach places. If you need to reach something above you, use a strong step stool that has a grab bar. Keep electrical cords out of the way. Do not use floor polish or wax that makes floors slippery. If you must use wax, use non-skid floor wax. Do not have throw rugs and other things on the floor that can make you trip. What can I do with my stairs? Do not leave any items on the stairs. Make sure that there are handrails on both sides of the stairs and use them. Fix handrails that are broken or loose. Make sure that handrails are as long as the stairways. Check any carpeting to make sure that it is firmly attached to the stairs. Fix any carpet that is loose or worn. Avoid having throw rugs at the top or bottom of the stairs. If you do have throw rugs, attach them to the floor with carpet tape. Make sure that you have a light switch at the top of the  stairs and the bottom of the stairs. If you do not have them, ask someone to add them for you. What else can I do to help prevent falls? Wear shoes that: Do not have high heels. Have rubber bottoms. Are comfortable and fit you well. Are closed at the toe. Do not wear sandals. If you use a stepladder: Make sure that it is fully opened. Do not climb a closed stepladder. Make sure that both sides of the stepladder are locked into place. Ask someone to hold it for you, if possible. Clearly mark and make sure that you can see: Any grab bars or handrails. First and last steps. Where the edge of each step is. Use tools that help you move around (mobility aids) if they are needed. These include: Canes. Walkers. Scooters. Crutches. Turn on the lights when you go into a dark area. Replace any light bulbs as soon as they burn out. Set up your furniture so you have a clear path. Avoid moving your furniture around. If any of your floors are uneven, fix them. If there are any pets around you, be aware of where they are. Review your medicines with your doctor. Some medicines can make you feel dizzy. This can increase your chance of falling. Ask your doctor what other things that you can do to help prevent falls. This information is not intended to replace advice given to you by your health care provider. Make sure you discuss any questions you have with your health care provider. Document Released: 12/26/2008 Document Revised: 08/07/2015 Document Reviewed: 04/05/2014 Elsevier Interactive Patient Education  2017 [employer].

## 2021-12-17 NOTE — Progress Notes (Signed)
Subjective:   Cory Barker is a 81 y.o. male who presents for Medicare Annual/Subsequent preventive examination.   Virtual Visit via Telephone Note  I connected with  Cory Barker on 12/17/21 at  8:45 AM EDT by telephone and verified that I am speaking with the correct person using two identifiers.  Location: Patient: home  Provider: [place]  Persons participating in the virtual visit: patient/Nurse Health Advisor   I discussed the limitations, risks, security and privacy concerns of performing an evaluation and management service by telephone and the availability of in person appointments. The patient expressed understanding and agreed to proceed.  Interactive audio and video telecommunications were attempted between this nurse and patient, however failed, due to patient having technical difficulties OR patient did not have access to video capability.  We continued and completed visit with audio only.  Some vital signs may be absent or patient reported.   [name], LPN  Review of Systems     Cardiac Risk Factors include: advanced age (>60mn, >>39women);male gender     Objective:    Today's Vitals   12/17/21 0846  Weight: 195 lb (88.5 kg)  Height: '5\' 10"'$  (1.778 m)   Body mass index is 27.98 kg/m.     12/17/2021    8:49 AM 10/01/2019    8:09 AM 09/27/2018    8:03 AM 09/21/2017    3:33 PM 12/17/2014    2:03 PM 12/03/2014    9:11 AM  Advanced Directives  Does Patient Have a Medical Advance Directive? No No No No No No  Would patient like information on creating a medical advance directive? No - Patient declined Yes (MAU/Ambulatory/Procedural Areas - Information given) Yes (MAU/Ambulatory/Procedural Areas - Information given) Yes (MAU/Ambulatory/Procedural Areas - Information given) No - patient declined information     Current Medications (verified) Outpatient Encounter Medications as of 12/17/2021  Medication Sig   Calcium Carb-Cholecalciferol (CALCIUM 1000 + D  PO) Take 1 tablet by mouth daily.   glucosamine-chondroitin 500-400 MG tablet Take 1 tablet by mouth daily.   No facility-administered encounter medications on file as of 12/17/2021.    Allergies (verified) Penicillins   History: Past Medical History:  Diagnosis Date   Allergy    Arthritis    Cancer ([facility]    skin cancers   Colon polyps    Diverticulosis    Hemorrhoids    Hyperlipidemia    Osteopenia    Osteoporosis    Past Surgical History:  Procedure Laterality Date   COLONOSCOPY     KNEE SURGERY     bilateral   TONSILLECTOMY     Family History  Problem Relation Age of Onset   Heart attack Mother    Lung cancer Father 578  Breast cancer Sister    Colon cancer Neg Hx    Social History   Socioeconomic History   Marital status: Married    Spouse name: Not on file   Number of children: 2   Years of education: Not on file   Highest education level: Not on file  Occupational History   Occupation: Retired  Tobacco Use   Smoking status: Former    Types: Cigarettes    Quit date: 03/16/1971    Years since quitting: 50.7   Smokeless tobacco: Never  Vaping Use   Vaping Use: Never used  Substance and Sexual Activity   Alcohol use: Yes    Alcohol/week: 0.0 standard drinks of alcohol    Comment: rarely   Drug  use: No   Sexual activity: Not Currently  Other Topics Concern   Not on file  Social History Narrative   Not on file   Social Determinants of Health   Financial Resource Strain: Low Risk  (12/17/2021)   Overall Financial Resource Strain (CARDIA)    Difficulty of Paying Living Expenses: Not hard at all  Food Insecurity: No Food Insecurity (12/17/2021)   Hunger Vital Sign    Worried About Running Out of Food in the Last Year: Never true    Ran Out of Food in the Last Year: Never true  Transportation Needs: No Transportation Needs (12/17/2021)   PRAPARE - Hydrologist (Medical): No    Lack of Transportation (Non-Medical): No   Physical Activity: Insufficiently Active (12/17/2021)   Exercise Vital Sign    Days of Exercise per Week: 3 days    Minutes of Exercise per Session: 30 min  Stress: No Stress Concern Present (12/17/2021)   [place]    Feeling of Stress : Not at all  Social Connections: Rosa Sanchez (12/17/2021)   Social Connection and Isolation Panel [NHANES]    Frequency of Communication with Friends and Family: More than three times a week    Frequency of Social Gatherings with Friends and Family: More than three times a week    Attends Religious Services: More than 4 times per year    Active Member of [employer] or Organizations: Yes    Attends Music therapist: More than 4 times per year    Marital Status: Married    Tobacco Counseling Counseling given: Not Answered   Clinical Intake:  Pre-visit preparation completed: Yes  Pain : No/denies pain     Nutritional Risks: None Diabetes: No  How often do you need to have someone help you when you read instructions, pamphlets, or other written materials from your doctor or pharmacy?: 1 - Never  Diabetic?no   Interpreter Needed?: No  Information entered by :: Jadene Pierini, LPN   Activities of Daily Living    12/17/2021    8:49 AM 12/15/2021    3:52 PM  In your present state of health, do you have any difficulty performing the following activities:  Hearing? 0 0   0  Vision? 0 0   0  Difficulty concentrating or making decisions? 0 0   0  Walking or climbing stairs? 0 0   0  Dressing or bathing? 0 0   0  Doing errands, shopping? 0 0   0  Preparing Food and eating ? N N   N  Using the Toilet? N N   N  In the past six months, have you accidently leaked urine? N N   N  Do you have problems with loss of bowel control? N N   N  Managing your Medications? N N   N  Managing your Finances? N N   N  Housekeeping or managing your Housekeeping? [name]    Patient Care Team: Midge Minium, MD as PCP - General (Family Medicine) Irene Shipper, MD as Consulting Physician (Gastroenterology) [facility] [place], Tamala Fothergill, DPM as Consulting Physician (Podiatry)  Indicate any recent Medical Services you may have received from other than [facility] providers in the past year (date may be approximate).     Assessment:   This is a routine wellness examination for [place].  Hearing/Vision screen Vision  Screening - Comments:: Annual eye exams wear s glasses   Dietary issues and exercise activities discussed: Current Exercise Habits: Home exercise routine, Type of exercise: walking, Time (Minutes): 30, Frequency (Times/Week): 3, Weekly Exercise (Minutes/Week): 90, Exercise limited by: None identified   Goals Addressed             This Visit's Progress    Patient Stated   On track    Continue current exercise regimen       Depression Screen    12/17/2021    8:48 AM 10/09/2021    8:42 AM 12/31/2020    1:44 PM 10/01/2019    8:11 AM 07/23/2019    8:32 AM 01/01/2019    8:29 AM 09/27/2018    8:04 AM  PHQ 2/9 Scores  PHQ - 2 Score 0 1 0 0 0 0 0  PHQ- 9 Score  4 2  0 0     Fall Risk    12/17/2021    8:47 AM 12/15/2021    3:52 PM 10/09/2021    8:43 AM 12/31/2020    1:44 PM 10/01/2019    8:09 AM  Fall Risk   Falls in the past year? 0 0   0 0 0 0  Number falls in past yr: 0 0   0 0  0  Injury with Fall? 0 0   0 0  0  Risk for fall due to : No Fall Risks  No Fall Risks No Fall Risks No Fall Risks  Follow up Falls prevention discussed  Falls evaluation completed Falls evaluation completed     FALL RISK PREVENTION PERTAINING TO THE HOME:  Any stairs in or around the home? Yes  If so, are there any without handrails? No  Home free of loose throw rugs in walkways, pet beds, electrical cords, etc? Yes  Adequate lighting in your home to reduce risk of falls? Yes   ASSISTIVE DEVICES UTILIZED TO PREVENT FALLS:  Life alert?  No  Use of a cane, walker or w/c? No  Grab bars in the bathroom? Yes  Shower chair or bench in shower? No  Elevated toilet seat or a handicapped toilet? No       09/27/2018    8:06 AM 09/21/2017    3:38 PM  MMSE - Mini Mental State Exam  Orientation to time 5 5  Orientation to Place 5 5  Registration 3 3  Attention/ Calculation 5 5  Recall 1 0  Language- name 2 objects 2 2  Language- repeat 1 1  Language- follow 3 step command 3 3  Language- read & follow direction 1 1  Write a sentence 1 1  Copy design 1 1  Total score 28 27        12/17/2021    8:50 AM  6CIT Screen  What Year? 0 points  What month? 0 points  What time? 0 points  Count back from 20 0 points  Months in reverse 0 points  Repeat phrase 0 points  Total Score 0 points    Immunizations Immunization History  Administered Date(s) Administered   Moderna Sars-Covid-2 Vaccination 05/03/2019, 05/31/2019   PFIZER(Purple Top)SARS-COV-2 Vaccination 03/26/2020   Pneumococcal Polysaccharide-23 11/23/2006    TDAP status: Due, Education has been provided regarding the importance of this vaccine. Advised may receive this vaccine at local pharmacy or Health Dept. Aware to provide a copy of the vaccination record if obtained from local pharmacy or Health Dept. Verbalized acceptance and understanding.  Flu  Vaccine status: Due, Education has been provided regarding the importance of this vaccine. Advised may receive this vaccine at local pharmacy or Health Dept. Aware to provide a copy of the vaccination record if obtained from local pharmacy or Health Dept. Verbalized acceptance and understanding.  Pneumococcal vaccine status: Up to date  Covid-19 vaccine status: Completed vaccines  Qualifies for Shingles Vaccine? Yes   Zostavax completed No   Shingrix Completed?: No.    Education has been provided regarding the importance of this vaccine. Patient has been advised to call insurance company to determine out of pocket  expense if they have not yet received this vaccine. Advised may also receive vaccine at local pharmacy or Health Dept. Verbalized acceptance and understanding.  Screening Tests Health Maintenance  Topic Date Due   INFLUENZA VACCINE  Never done   TETANUS/TDAP  12/31/2021 (Originally 03/30/1959)   HPV VACCINES  Aged Out   Pneumonia Vaccine 61+ Years old  Discontinued   COLONOSCOPY (Pts 45-60yr Insurance coverage will need to be confirmed)  Discontinued   COVID-19 Vaccine  Discontinued   Zoster Vaccines- Shingrix  Discontinued    Health Maintenance  Health Maintenance Due  Topic Date Due   INFLUENZA VACCINE  Never done    Colorectal cancer screening: No longer required.   Lung Cancer Screening: (Low Dose CT Chest recommended if Age 80-80years, 30 pack-year currently smoking OR have quit w/in 15years.) does not qualify.   Lung Cancer Screening Referral: n/a  Additional Screening:  Hepatitis C Screening: does not qualify;   Vision Screening: Recommended annual ophthalmology exams for early detection of glaucoma and other disorders of the eye. Is the patient up to date with their annual eye exam?  Yes  Who is the provider or what is the name of the office in which the patient attends annual eye exams? Dr.Palmer  If pt is not established with a provider, would they like to be referred to a provider to establish care? No .   Dental Screening: Recommended annual dental exams for proper oral hygiene  Community Resource Referral / Chronic Care Management: CRR required this visit?  No   CCM required this visit?  No      Plan:     I have personally reviewed and noted the following in the patient's chart:   Medical and social history Use of alcohol, tobacco or illicit drugs  Current medications and supplements including opioid prescriptions. Patient is not currently taking opioid prescriptions. Functional ability and status Nutritional status Physical activity Advanced  directives List of other physicians Hospitalizations, surgeries, and ER visits in previous 12 months Vitals Screenings to include cognitive, depression, and falls Referrals and appointments  In addition, I have reviewed and discussed with patient certain preventive protocols, quality metrics, and best practice recommendations. A written personalized care plan for preventive services as well as general preventive health recommendations were provided to patient.     LDaphane Shepherd LPN   [id number]  Nurse Notes: Due Tdap/Flu Vaccine

## 2021-12-19 ENCOUNTER — Encounter: Payer: Self-pay | Admitting: Family Medicine

## 2021-12-22 ENCOUNTER — Encounter: Payer: Self-pay | Admitting: Neurology

## 2021-12-22 ENCOUNTER — Ambulatory Visit (INDEPENDENT_AMBULATORY_CARE_PROVIDER_SITE_OTHER): Payer: Medicare Other | Admitting: Neurology

## 2021-12-22 ENCOUNTER — Other Ambulatory Visit (INDEPENDENT_AMBULATORY_CARE_PROVIDER_SITE_OTHER): Payer: Medicare Other

## 2021-12-22 VITALS — BP 146/72 | HR 53 | Ht 70.0 in | Wt 198.0 lb

## 2021-12-22 DIAGNOSIS — R29898 Other symptoms and signs involving the musculoskeletal system: Secondary | ICD-10-CM

## 2021-12-22 DIAGNOSIS — Z131 Encounter for screening for diabetes mellitus: Secondary | ICD-10-CM

## 2021-12-22 DIAGNOSIS — R209 Unspecified disturbances of skin sensation: Secondary | ICD-10-CM

## 2021-12-22 DIAGNOSIS — H532 Diplopia: Secondary | ICD-10-CM

## 2021-12-22 DIAGNOSIS — M1990 Unspecified osteoarthritis, unspecified site: Secondary | ICD-10-CM

## 2021-12-22 DIAGNOSIS — I951 Orthostatic hypotension: Secondary | ICD-10-CM

## 2021-12-22 DIAGNOSIS — E139 Other specified diabetes mellitus without complications: Secondary | ICD-10-CM

## 2021-12-22 DIAGNOSIS — R2681 Unsteadiness on feet: Secondary | ICD-10-CM

## 2021-12-22 LAB — HEMOGLOBIN A1C: Hgb A1c MFr Bld: 5.7 % (ref 4.6–6.5)

## 2021-12-22 LAB — CK: Total CK: 280 U/L — ABNORMAL HIGH (ref 7–232)

## 2021-12-22 NOTE — Addendum Note (Signed)
Addended by: Renae Gloss on: 12/22/2021 11:14 AM   Modules accepted: Orders

## 2021-12-22 NOTE — Patient Instructions (Addendum)
I saw you today for left leg weakness. I did not see significant weakness today, but did see some other minor abnormalities today such as the double vision when looking up that could be related. I would like to evaluate further with lab tests. Depending on those results, I may have further testing that I recommend.  I will be in touch with you when I have your results. Please let me know if you have any questions or concerns in the meantime.   I would like to see you back before you go to [place], in about 2 months. Make this appointment prior to leaving today.  The physicians and staff at [facility] are committed to providing excellent care. You may receive a survey requesting feedback about your experience at our office. We strive to receive "very good" responses to the survey questions. If you feel that your experience would prevent you from giving the office a "very good " response, please contact our office to try to remedy the situation. We may be reached at [phone]. Thank you for taking the time out of your busy day to complete the survey.  Kai Levins, MD [facility]   Guidelines for the Nonpharmacological Treatment of Orthostatic Hypotension/ Orthostatic Tachycardia/ Orthostatic Intolerance  Make all postural changes from lying to sitting or sitting to standing, slowly.  Drink 2.0-2.5 L of fluid per day (if okay with your other doctors).  Increase sodium in the diet to 3-5 g per day (if okay with your other doctors).  Avoid large meals which can cause low blood pressure during digestion. It is better to eat smaller meals more often than 3 large meals.  Avoid alcohol. Alcohol can cause blood to pool in the legs which may worsen low blood pressure reactions when standing.  Perform lower extremity exercises to improve strength of the leg muscles. This will help prevent blood from pooling in the legs when standing and walking.  Raise the head of the bed by 6-10 inches.  The entire bed must be at an angle. Raising only the head portion of the bed at the waist level or using pillows will not be effective. Raising the head of the bed will reduce urine formation overnight and there will be more volume in the circulation in the morning. It may also help orthostatic tolerance during the day.  During bad days or prior to engaging in more physical activity than usual, drink 500 ml of water quickly. This will result in increased blood pressure within 5 minutes of drinking the water. The effect will last up to a few hours and may improve orthostatic intolerance.  Use custom-fitted elastic support stockings. This will reduce a tendency for blood to pool in the legs when standing and may improve orthostatic intolerance. Abdominal binder or "SPANX" may also be useful.  Use physical counter-maneuvers such as leg crossing, squatting, or raising and resting the leg on a chair. These maneuvers increase blood pressure and can improve orthostatic intolerance.  Gently escalated aerobic physical activity program for graded reconditioning (see details below)  Home-based Exercise Plan for Patients with Orthostatic Intolerance (as may be seen in POTS and related disorders)  Level 1 - Reclined Gentle Movements This is the starting point for those patients most severely disabled by an autonomic disorder. If you are bedridden, this is your first step. We have known dysautonomia patients who were bedridden for years, and we able to work their way from "barely able to move" to biking 45 minutes a  day, everyday. It will be hard. It may make you feel worse in the beginning. But the human body was meant to move. Just take baby steps until you can make it to your goal. Some patients can only do one minute a day, or one minute at a time, a few times a day. And they do this every day for a week, and then they increase to two minutes a day the next week. This is a very slow process, but improving your  health slowly is better than not improving it at all. Helpful exercises may include:  Leg Pillow Squeeze - while laying down or reclined in bed, put a pillow folded between your knees and squeeze. Hold it for 10 seconds. Repeat.  Arm Pillow Squeeze - put the pillow folded between your palms and squeeze together as though you were putting your hands into a praying position. Hold it for 10 seconds. Repeat.  Alphabet Toes - while laying in bed, write your name in the air with your toes. If you can build up your strength, write the whole alphabet. Do this several times a day.  Side Leg Lifts - while laying on your side, lift your leg up sideways and then bring your leg back down, without touching your legs together. Repeat.  Front Leg Lifts - While laying on your back, life your left leg up, pointing your toe towards the ceiling. Repeat. Switch to right leg.  Gentle Stretching - any kind of stretching helps move blood around in the body and takes stress of your joints if you have been sitting or laying in the same position for a long time. Go through the entire body doing mild stretches, from feet, to legs, to back, to arms, to neck. Doing this when you wake up can be a great way to start the day, and repeating your stretches before bed can help you relax and sleep better.  Level 2 - Recumbent Cardio Exercises This is probably the level that most patients will be able to begin with, although everyone can benefit from the gentle stretching and toning exercises described in Level 1.  Always begin your workout with 5-10 minutes of stretching and/or yoga to warm up your muscles and protect your joints from injury.  Since the point of these exercises is to get your cardiovascular system to be more efficient, you will want to set a target heart rate for your workout. You should speak to your doctor about this because medications and other medical conditions can impact your target heart rate, but most  patients can tolerate a workout at 75% to 80% of their maximum heart rate. [facility] has a Target Heart Rate Calculator you can use as a guide when speaking with your doctor.  You may want to purchase an exercise heart rate monitor to wear during your workouts to help you keep your heart rate within your target zone. We do not endorse any specific products, but exercise heart rate monitors with chest straps are usually more accurate than pulse oximeters you place on your finger. This is especially so for dysautonomia patients who have abnormalities in peripheral blood flow (common in some forms of dysautonomia), as this is more likely to give an inaccurate reading using a finger based monitor.  Suggested reclined cardiovascular exercises include:  Rowing - use a rowing machine, or if you are feeling well enough, a kayak. You may want to start out slow, maybe 2-5 minutes a day. At your own pace,  adding a few minutes per week, try to work your way up to 45 minutes per day, 5 days a week, with 30 minutes of your routine done in your target heart rate zone. Be sure to warm up at the beginning and cool down at the end.  Recumbent Biking - recumbent exercise bikes are different than regular exercise bikes. They seat the rider in a reclined position, rather than upright. Try recumbent biking a few minutes a day, adding a few minutes each week, until you can work out 45 minutes a day, five days a week, with 30 minutes of that workout in your target heart rate zone. Be sure to warm up at the beginning and cool down at the end of each workout.  Swimming - The pressure from water helps prevent orthostatic symptoms. Dysautonomia patients who have been bedridden for years may be able to stand upright for an hour in a pool, because the pressure from the water prevents orthostatic symptoms from occurring, or lessens their impact. Dysautonomia patients can take advantage of this to get a good cardio workout, or to focus  on stretching and strength training in the water. Always swim with a spotter or a buddy who can keep and eye on you, just in case you develop lightheadedness or other symptoms that would make it unsafe to be in a pool. It may be best to start your swimming exercise program at a pool with a lifeguard, or with a Physical Therapist who specializes in aquatic therapy. A good old fashioned kick board can be a great tool for dysautonomia patients. You can kick your way around the pool, which gives you a good cardio workout, and all that kicking helps strengthen your legs. Toning up your legs and core is a great way to minimize orthostatic symptoms.  Weight Training - Most dysautonomia patients can benefit from overall increase in tone and strength. The stronger our muscles are, the more efficiently they use oxygen, the better we will be able to tolerate orthostatic stress. Special emphasis can be placed on strengthening leg and core muscles. Some patients find it useful to wear 2 to 4 lb. weights that attach to the ankles with velcro. This can really help with leg strength if you wear them often enough. There are also machines at the gym that assist with core and leg weight training. Begin with light weights, and use them in a reclined or seated position. Some patients become extra symptomatic when lifting their arms over their head, so take extra care if attempting to do that.  Level 3 - Normal Workouts Some dysautonomia patients are able to jog, run marathons or walk several miles a week. These patients should do whatever they can to continue these activities. Dysautonomia patients who are well-conditioned should exercise 45 minutes a day, at least 3 days per week. Special emphasis should be placed on leg and core strength, and cardiovascular exercises.    Preventing Falls at [facility] are common, often dreaded events in the lives of older people. Aside from the obvious injuries and even death that may  result, fall can cause wide-ranging consequences including loss of independence, mental decline, decreased activity and mobility. Younger people are also at risk of falling, especially those with chronic illnesses and fatigue.  Ways to reduce risk for falling Examine diet and medications. Warm foods and alcohol dilate blood vessels, which can lead to dizziness when standing. Sleep aids, antidepressants and pain medications can also increase the likelihood of  a fall.  Get a vision exam. Poor vision, cataracts and glaucoma increase the chances of falling.  Check foot gear. Shoes should fit snugly and have a sturdy, nonskid sole and a broad, low heel  Participate in a physician-approved exercise program to build and maintain muscle strength and improve balance and coordination. Programs that use ankle weights or stretch bands are excellent for muscle-strengthening. Water aerobics programs and low-impact Tai Chi programs have also been shown to improve balance and coordination.  Increase vitamin D intake. Vitamin D improves muscle strength and increases the amount of calcium the body is able to absorb and deposit in bones.  How to prevent falls from common hazards Floors - Remove all loose wires, cords, and throw rugs. Minimize clutter. Make sure rugs are anchored and smooth. Keep furniture in its usual place.  Chairs -- Use chairs with straight backs, armrests and firm seats. Add firm cushions to existing pieces to add height.  Bathroom - Install grab bars and non-skid tape in the tub or shower. Use a bathtub transfer bench or a shower chair with a back support Use an elevated toilet seat and/or safety rails to assist standing from a low surface. Do not use towel racks or bathroom tissue holders to help you stand.  Lighting - Make sure halls, stairways, and entrances are well-lit. Install a night light in your bathroom or hallway. Make sure there is a light switch at the top and bottom of the  staircase. Turn lights on if you get up in the middle of the night. Make sure lamps or light switches are within reach of the bed if you have to get up during the night.  Kitchen - Install non-skid rubber mats near the sink and stove. Clean spills immediately. Store frequently used utensils, pots, pans between waist and eye level. This helps prevent reaching and bending. Sit when getting things out of lower cupboards.  Living room/ Bedrooms - Place furniture with wide spaces in between, giving enough room to move around. Establish a route through the living room that gives you something to hold onto as you walk.  Stairs - Make sure treads, rails, and rugs are secure. Install a rail on both sides of the stairs. If stairs are a threat, it might be helpful to arrange most of your activities on the lower level to reduce the number of times you must climb the stairs.  Entrances and doorways - Install metal handles on the walls adjacent to the doorknobs of all doors to make it more secure as you travel through the doorway.  Tips for maintaining balance Keep at least one hand free at all times. Try using a backpack or fanny pack to hold things rather than carrying them in your hands. Never carry objects in both hands when walking as this interferes with keeping your balance.  Attempt to swing both arms from front to back while walking. This might require a conscious effort if Parkinson's disease has diminished your movement. It will, however, help you to maintain balance and posture, and reduce fatigue.  Consciously lift your feet off of the ground when walking. Shuffling and dragging of the feet is a common culprit in losing your balance.  When trying to navigate turns, use a "U" technique of facing forward and making a wide turn, rather than pivoting sharply.  Try to stand with your feet shoulder-length apart. When your feet are close together for any length of time, you increase your risk of losing  your balance and falling.  Do one thing at a time. Don't try to walk and accomplish another task, such as reading or looking around. The decrease in your automatic reflexes complicates motor function, so the less distraction, the better.  Do not wear rubber or gripping soled shoes, they might "catch" on the floor and cause tripping.  Move slowly when changing positions. Use deliberate, concentrated movements and, if needed, use a grab bar or walking aid. Count 15 seconds between each movement. For example, when rising from a seated position, wait 15 seconds after standing to begin walking.  If balance is a continuous problem, you might want to consider a walking aid such as a cane, walking stick, or walker. Once you've mastered walking with help, you might be ready to try it on your own again.

## 2021-12-23 LAB — VITAMIN B12: Vitamin B-12: 1376 pg/mL — ABNORMAL HIGH (ref 211–911)

## 2021-12-30 LAB — IMMUNOFIXATION ELECTROPHORESIS
IgG (Immunoglobin G), Serum: 1133 mg/dL (ref 600–1540)
IgM, Serum: 137 mg/dL (ref 50–300)
Immunoglobulin A: 212 mg/dL (ref 70–320)

## 2021-12-30 LAB — ACETYLCHOLINE RECEPTOR, MODULATING: Acetylchol Modul Ab: 5 % Inhibition

## 2021-12-30 LAB — ACETYLCHOLINE RECEPTOR, BINDING: A CHR BINDING ABS: <0.3 nmol/L

## 2021-12-30 LAB — ACETYLCHOLINE RECEPTOR, BLOCKING: ACHR Blocking Abs: <15 % Inhibition (ref <15)

## 2021-12-31 ENCOUNTER — Encounter: Payer: Self-pay | Admitting: Neurology

## 2022-01-04 ENCOUNTER — Encounter: Payer: Medicare Other | Admitting: Family Medicine

## 2022-01-04 ENCOUNTER — Ambulatory Visit (INDEPENDENT_AMBULATORY_CARE_PROVIDER_SITE_OTHER)
Admission: RE | Admit: 2022-01-04 | Discharge: 2022-01-04 | Disposition: A | Discharge status: Home / Self Care | Payer: Medicare Other | Source: Ambulatory Visit | Attending: Family Medicine | Admitting: Family Medicine

## 2022-01-04 DIAGNOSIS — M85852 Other specified disorders of bone density and structure, left thigh: Secondary | ICD-10-CM

## 2022-01-04 DIAGNOSIS — M85851 Other specified disorders of bone density and structure, right thigh: Secondary | ICD-10-CM | POA: Diagnosis not present

## 2022-02-18 NOTE — Progress Notes (Signed)
I saw Cory Barker in neurology clinic on 02/24/22 in follow up for left leg weakness and gait imbalance.  HPI: Cory Barker is a 81 y.o. year old male with a history of HLD, osteopenia, OA  who we last saw on 12/22/21.  To briefly review: Patient has had left leg weakness for about the last 1.5 years. He feels like it has been very slowly progressive. He particularly has difficulty with stairs. He has tried to exercise, but has not seemed to make a big different. Activity does not change symptoms significantly. He does mention that he plays golf and tries to walk about 5 miles while doing so and will feel weaker in the leg afterward. He denies significant pain in the back. He denies significant numbness or tingling, but occasionally has tingling in the feet which he attributes to walking on his hardwood floors at home. He denies any falls in the last 3-4 years, but is more careful. He mentions he still will climb ladders. He likes to go to his garage and tinker.   He does have knee discomfort bilaterally, thought to be due to OA.   The patient has not had similar episodes of symptoms in the past.     Muscle bulk loss? Does feel like he has lost same mass around his shoulders Muscle pain? No  Cramps/Twitching? Occasional cramp in calf; denies twitching   Able to brush hair/teeth without difficulty? yes  Able to button shirts/use zips? yes  Clumsiness/dropping grasped objects? Rare, but has happened Able to get out of chair without using arms? Yes  Able to walk up steps easily? Not easily Use an assistive device to walk? No  Significant imbalance with walking? Occasionally lightheaded, some unsteady gait since turning 81    He has occasional double vision. He has a rasp and quiet speaking voice that patient says has been present since he was a child.   There are no neuromuscular respiratory weakness symptoms, particularly orthopnea>dyspnea.    Pseudobulbar affect is absent.   The  patient has not noticed any recent skin rashes nor does he report any constitutional symptoms like fever, night sweats, anorexia or unintentional weight loss.   EtOH use: 15 cans of light beer over 2-3 weeks  Restrictive diet? No, but tries not to eat beef Family history of neuropathy/myopathy/NM disease? No   Patient saw his PCP, Dr. Birdie Riddle on 10/09/21 for left leg weakness, particularly when going up stairs. Patient referred to PT and neurology for further evaluation. Patient did not ever go to PT. He relates that he has been busy taking care of his wife.   Patient also mentions that his fingers get swollen. He wonders if this is connected.  Most recent Assessment and Plan (12/22/21): His neurological examination is pertinent for diplopia with sustained up gaze and loss of vibration sense in bilateral feet. Notably, I did not find any objective weakness of the left leg today.    The etiology of patient's symptoms is currently unclear and may be multifactorial. Possible considerations include weakness due to deconditioning, arthritis, or mild neuropathy. I would not expect neuropathy to cause proximal muscle problems though. Other possibilities include myopathy or myasthenia gravis. Given the normal muscle strength today, myopathy seems less likely. I will send lab work today and potentially expand work up based on those results.    PLAN: -Blood work: B12, HbA1c, IFE, AChR abs, CK -Patient encouraged to stay active, but safe. Orthostatic intolerance recommendations given as he mentioned lightheadedness  with standing.  Since their last visit: Patient's left leg pain and inability to push off have improved over the last few weeks. Patient continues to have swelling of third digit of the right hand. He is unable to straighten it well. It comes and goes. He denies pain, but does endorse discomfort, like something is stretching the finger (pointing at the palm and tendon).  Labs, including AChR abs  were unremarkable.  Current MG-like symptoms: Ptosis: None Double vision: Still having occasional double vision, not often. Speech: None recently Chewing: None Swallowing: Very infrequent difficulty swallowing (1-2 times in last year) Breathing: No problems Arm strength: No problems Leg strength: Improving left leg, no other issue  Patient has been less active of late due to having to take care of his wife after her operations. He plans to restart his bike soon though.  Patient denies any other significant changes.   MEDICATIONS:  Outpatient Encounter Medications as of 02/24/2022  Medication Sig   Calcium Carb-Cholecalciferol (CALCIUM 1000 + D PO) Take 1 tablet by mouth daily.   glucosamine-chondroitin 500-400 MG tablet Take 1 tablet by mouth daily.   vitamin B-12 (CYANOCOBALAMIN) 100 MCG tablet Take 100 mcg by mouth in the morning and at bedtime.   No facility-administered encounter medications on file as of 02/24/2022.    PAST MEDICAL HISTORY: Past Medical History:  Diagnosis Date   Allergy    Arthritis    Cancer ([place])    skin cancers   Colon polyps    Diverticulosis    Hemorrhoids    Hyperlipidemia    Osteopenia    Osteoporosis     PAST SURGICAL HISTORY: Past Surgical History:  Procedure Laterality Date   COLONOSCOPY     KNEE SURGERY     bilateral   TONSILLECTOMY      ALLERGIES: Allergies  Allergen Reactions   Penicillins Rash    FAMILY HISTORY: Family History  Problem Relation Age of Onset   Heart attack Mother    Lung cancer Father 66   Breast cancer Sister    Colon cancer Neg Hx     SOCIAL HISTORY: Social History   Tobacco Use   Smoking status: Former    Types: Cigarettes    Quit date: 03/16/1971    Years since quitting: 50.9   Smokeless tobacco: Never  Vaping Use   Vaping Use: Never used  Substance Use Topics   Alcohol use: Yes    Alcohol/week: 0.0 standard drinks of alcohol    Comment: rarely   Drug use: No   Social History    Social History Narrative   Left handed    Caffeine rarely   Live in a two story home    Objective:  Vital Signs:  BP 133/68   Pulse 65   Ht '5\' 10"'$  (1.778 m)   Wt 209 lb (94.8 kg)   SpO2 93%   BMI 29.99 kg/m   General: General appearance: Awake and alert. No distress. Cooperative with exam. \ HEENT: Atraumatic. Anicteric. Lungs: Non-labored breathing on room air  Extremities: No edema. No obvious deformity.  Musculoskeletal: No obvious joint swelling.  Neurological: Mental Status: Alert. Speech fluent. No pseudobulbar affect Cranial Nerves: CNII: No RAPD. Visual fields intact. CNIII, IV, VI: PERRL. No nystagmus. EOMI. Diplopia with sustained up gaze. Cogan lid twitch positive. CN V: Facial sensation intact bilaterally to fine touch. CN VII: Facial muscles symmetric and strong. No ptosis at rest or after sustained upgaze. CN VIII: Hears finger rub well bilaterally.  CN IX: No hypophonia. CN X: Palate elevates symmetrically. CN XI: Full strength shoulder shrug bilaterally. CN XII: Tongue protrusion full and midline. No atrophy or fasciculations. No significant dysarthria Motor: Tone is normal. 5/5 in bilateral upper and lower extremities Reflexes:  Right Left  Bicep 1+ 1+  Tricep 1+ 1+  BrRad 1+ 1+  Knee 1+ 1+  Ankle 0 0   Sensation: Intact to light touch Gait: Able to rise from chair with arms crossed unassisted. Normal, narrow-based gait.   Lab and Test Review: New results: 12/22/21: Normal or unremarkable: IFE, A1c (5.7), B12 (1376) AChR abs (binding, blocking, modulating): negative CK: 280   Previously reviewed results: Internal labs: Normal or unremarkable: vit D, CBC, LFTs, TSH BMP significant for mildly elevated glucose and BUN   MRI cervical spine wo contrast (08/15/2013): FINDINGS:  Foramen magnum is widely patent. There is ordinary osteoarthritis of  the C1-2 articulation but no encroachment upon the neural spaces.   C2-3: There is facet  arthropathy on the right. No canal or foraminal  stenosis.   C3-4: There is spondylosis with endplate osteophytes and bulging of  the disc slightly more prominent towards the left. There is facet  degeneration on the left. The central canal is widely patent. There  is mild foraminal narrowing on the left.   C4-5: Endplate osteophytes and mild bulging of the disc. There is  mild facet degeneration on the left. The central canal is widely  patent. There is mild foraminal narrowing on the left.   C5-6: There is spondylosis with endplate osteophytes and there is a  broad-based disc herniation. The ventral subarachnoid space is  effaced in the cord is indented. No cord edema. There is mild facet  hypertrophy. There is foraminal stenosis bilaterally that could  affect the C6 nerve roots.   C6-7: Mild bulging of the disc.  No canal or foraminal stenosis.   C7-T1:  Normal interspace.  No stenosis.   No abnormal cord edema or other focal cord lesion.   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 Effacement of the  ventral subarachnoid space with slight indentation of the cord.  Foraminal stenosis bilaterally that could affect either or both C6  nerve roots.   Upper cervical facet arthropathy that could be a cause of neck pain.  This is most pronounced on the right at C2-3 but also noted on the  left at C3-4 and C4-5. There is mild foraminal narrowing on the left  at C3-4 and C4-5 without gross neural compression.   ASSESSMENT: This is Cory Barker, a 81 y.o. male with:  Left leg pain and weakness - improved. Again, no objective evidence of weakness today. Will continue to monitor. Diplopia - only with sustained up gaze. I tested for MG antibodies, which were negative. No definitive evidence of MG currently. Very infrequent symptoms. Right 3rd digit pain and tightness - does not appear to be nerve or muscle related, perhaps tendon. Symptoms are currently mild, and  patient does not think intervention is necessary currently.  Plan: -Will continue to monitor symptoms -Will discuss finger with PCP if symptoms worsen -Discussed warning signs of MG and to call with new or worsening symptoms.  Return to clinic in 6 months  Total time spent reviewing records, interview, history/exam, documentation, and coordination of care on day of encounter:  30 min  Kai Levins, MD

## 2022-02-24 ENCOUNTER — Ambulatory Visit (INDEPENDENT_AMBULATORY_CARE_PROVIDER_SITE_OTHER): Payer: Medicare Other | Admitting: Neurology

## 2022-02-24 ENCOUNTER — Encounter: Payer: Self-pay | Admitting: Neurology

## 2022-02-24 VITALS — BP 133/68 | HR 65 | Ht 70.0 in | Wt 209.0 lb

## 2022-02-24 DIAGNOSIS — M79644 Pain in right finger(s): Secondary | ICD-10-CM

## 2022-02-24 DIAGNOSIS — M1990 Unspecified osteoarthritis, unspecified site: Secondary | ICD-10-CM

## 2022-02-24 DIAGNOSIS — I951 Orthostatic hypotension: Secondary | ICD-10-CM

## 2022-02-24 DIAGNOSIS — R29898 Other symptoms and signs involving the musculoskeletal system: Secondary | ICD-10-CM | POA: Diagnosis not present

## 2022-02-24 NOTE — Patient Instructions (Signed)
I would like to follow up in 6 months to make sure you continue to improve.  Please let me know if you have any questions or concerns in the meantime.  The physicians and staff at [facility] are committed to providing excellent care. You may receive a survey requesting feedback about your experience at our office. We strive to receive "very good" responses to the survey questions. If you feel that your experience would prevent you from giving the office a "very good " response, please contact our office to try to remedy the situation. We may be reached at [phone]. Thank you for taking the time out of your busy day to complete the survey.  Kai Levins, MD [facility]

## 2022-08-20 NOTE — Progress Notes (Signed)
NEUROLOGY FOLLOW UP OFFICE NOTE  Cory Barker [id number]  Subjective:  Cory Barker is a 82 y.o. year old male with a history of HLD, osteopenia, OA who we last saw on 02/24/22.  To briefly review: Patient has had left leg weakness for about the last 1.5 years. He feels like it has been very slowly progressive. He particularly has difficulty with stairs. He has tried to exercise, but has not seemed to make a big different. Activity does not change symptoms significantly. He does mention that he plays golf and tries to walk about 5 miles while doing so and will feel weaker in the leg afterward. He denies significant pain in the back. He denies significant numbness or tingling, but occasionally has tingling in the feet which he attributes to walking on his hardwood floors at home. He denies any falls in the last 3-4 years, but is more careful. He mentions he still will climb ladders. He likes to go to his garage and tinker.   He does have knee discomfort bilaterally, thought to be due to OA.   The patient has not had similar episodes of symptoms in the past.     Muscle bulk loss? Does feel like he has lost same mass around his shoulders Muscle pain? No  Cramps/Twitching? Occasional cramp in calf; denies twitching   Able to brush hair/teeth without difficulty? yes  Able to button shirts/use zips? yes  Clumsiness/dropping grasped objects? Rare, but has happened Able to get out of chair without using arms? Yes  Able to walk up steps easily? Not easily Use an assistive device to walk? No  Significant imbalance with walking? Occasionally lightheaded, some unsteady gait since turning 81    He has occasional double vision. He has a rasp and quiet speaking voice that patient says has been present since he was a child.   There are no neuromuscular respiratory weakness symptoms, particularly orthopnea>dyspnea.    Pseudobulbar affect is absent.   The patient has not noticed any recent skin  rashes nor does he report any constitutional symptoms like fever, night sweats, anorexia or unintentional weight loss.   EtOH use: 15 cans of light beer over 2-3 weeks  Restrictive diet? No, but tries not to eat beef Family history of neuropathy/myopathy/NM disease? No   Patient saw his PCP, Dr. Beverely Low on 10/09/21 for left leg weakness, particularly when going up stairs. Patient referred to PT and neurology for further evaluation. Patient did not ever go to PT. He relates that he has been busy taking care of his wife.   Patient also mentions that his fingers get swollen. He wonders if this is connected.  02/24/22: Patient's left leg pain and inability to push off have improved over the last few weeks. Patient continues to have swelling of third digit of the right hand. He is unable to straighten it well. It comes and goes. He denies pain, but does endorse discomfort, like something is stretching the finger (pointing at the palm and tendon).   Labs, including AChR abs were unremarkable.   Current MG-like symptoms: Ptosis: None Double vision: Still having occasional double vision, not often. Speech: None recently Chewing: None Swallowing: Very infrequent difficulty swallowing (1-2 times in last year) Breathing: No problems Arm strength: No problems Leg strength: Improving left leg, no other issue   Patient has been less active of late due to having to take care of his wife after her operations. He plans to restart his bike soon though.  Most  recent Assessment and Plan (02/24/22): This is Cory Barker, a 82 y.o. male with:   Left leg pain and weakness - improved. Again, no objective evidence of weakness today. Will continue to monitor. Diplopia - only with sustained up gaze. I tested for MG antibodies, which were negative. No definitive evidence of MG currently. Very infrequent symptoms. Right 3rd digit pain and tightness - does not appear to be nerve or muscle related, perhaps tendon.  Symptoms are currently mild, and patient does not think intervention is necessary currently.   Plan: -Will continue to monitor symptoms -Will discuss finger with PCP if symptoms worsen -Discussed warning signs of MG and to call with new or worsening symptoms.  Since their last visit: Patient is currently having no issues with his left leg. He feels this has gotten better. He still gets occasional double vision, usually when tired, that resolves quickly when concentrating. He does endorse dry eyes. He does not use eye drops. He denies ptosis, chewing or swallowing difficulties, difficulty breathing, or arm or leg weakness.  Patient is having difficulty straightening out his right 3rd digit. He denies significant pain and no numbness and tingling. There is a tight band like thing in his right palm going to that finger. He mentioned this at last visit.  MEDICATIONS:  Outpatient Encounter Medications as of 08/27/2022  Medication Sig   Calcium Carb-Cholecalciferol (CALCIUM 1000 + D PO) Take 1 tablet by mouth daily.   glucosamine-chondroitin 500-400 MG tablet Take 1 tablet by mouth daily.   NON FORMULARY Focus Factor   vitamin B-12 (CYANOCOBALAMIN) 100 MCG tablet Take 100 mcg by mouth in the morning and at bedtime.   No facility-administered encounter medications on file as of 08/27/2022.    PAST MEDICAL HISTORY: Past Medical History:  Diagnosis Date   Allergy    Arthritis    Cancer ([facility])    skin cancers   Colon polyps    Diverticulosis    Hemorrhoids    Hyperlipidemia    Osteopenia    Osteoporosis     PAST SURGICAL HISTORY: Past Surgical History:  Procedure Laterality Date   COLONOSCOPY     KNEE SURGERY     bilateral   TONSILLECTOMY      ALLERGIES: Allergies  Allergen Reactions   Penicillins Rash    FAMILY HISTORY: Family History  Problem Relation Age of Onset   Heart attack Mother    Lung cancer Father 40   Breast cancer Sister    Colon cancer Neg Hx     SOCIAL  HISTORY: Social History   Tobacco Use   Smoking status: Former    Types: Cigarettes    Quit date: 03/16/1971    Years since quitting: 51.4   Smokeless tobacco: Never  Vaping Use   Vaping Use: Never used  Substance Use Topics   Alcohol use: Yes    Alcohol/week: 0.0 standard drinks of alcohol    Comment: rarely   Drug use: No   Social History   Social History Narrative   Left handed    Caffeine rarely   Live in a two story home      Objective:  Vital Signs:  BP 135/63   Pulse (!) 56   Ht 5\' 10"  (1.778 m)   Wt 209 lb (94.8 kg)   SpO2 96%   BMI 29.99 kg/m   General: No acute distress.  Patient appears well-groomed.   Head:  Normocephalic/atraumatic Neck: supple, no paraspinal tenderness, full range of motion Extremities: 3rd  digit of right hand cannot fully extend. Appears to be tendon visible in palm.  Neurological Exam: Mental status: alert and oriented, speech fluent and not dysarthric, language intact.  Cranial nerves: CN I: not tested CN II: pupils equal, round and reactive to light, visual fields intact CN III, IV, VI:  full range of motion, no nystagmus, no ptosis CN V: facial sensation intact. CN VII: upper and lower face symmetric CN VIII: hearing intact CN IX, X: uvula midline CN XI: sternocleidomastoid and trapezius muscles intact CN XII: tongue midline  Bulk & Tone: normal, no fasciculations. Motor:  muscle strength 5/5 throughout Deep Tendon Reflexes:  1+ throughout except absent at bilateral ankles   Sensation:  Light touch sensation intact. Finger to nose testing:  Without dysmetria.   Gait:  Normal station and stride.   Labs and Imaging review: No new results  Previously reviewed results: 12/22/21: Normal or unremarkable: IFE, A1c (5.7), B12 (1376) AChR abs (binding, blocking, modulating): negative CK: 280  Normal or unremarkable: vit D, CBC, LFTs, TSH BMP significant for mildly elevated glucose and BUN   MRI cervical spine wo  contrast (08/15/2013): FINDINGS:  Foramen magnum is widely patent. There is ordinary osteoarthritis of  the C1-2 articulation but no encroachment upon the neural spaces.   C2-3: There is facet arthropathy on the right. No canal or foraminal  stenosis.   C3-4: There is spondylosis with endplate osteophytes and bulging of  the disc slightly more prominent towards the left. There is facet  degeneration on the left. The central canal is widely patent. There  is mild foraminal narrowing on the left.   C4-5: Endplate osteophytes and mild bulging of the disc. There is  mild facet degeneration on the left. The central canal is widely  patent. There is mild foraminal narrowing on the left.   C5-6: There is spondylosis with endplate osteophytes and there is a  broad-based disc herniation. The ventral subarachnoid space is  effaced in the cord is indented. No cord edema. There is mild facet  hypertrophy. There is foraminal stenosis bilaterally that could  affect the C6 nerve roots.   C6-7: Mild bulging of the disc.  No canal or foraminal stenosis.   C7-T1:  Normal interspace.  No stenosis.   No abnormal cord edema or other focal cord lesion.   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 Effacement of the  ventral subarachnoid space with slight indentation of the cord.  Foraminal stenosis bilaterally that could affect either or both C6  nerve roots.   Upper cervical facet arthropathy that could be a cause of neck pain.  This is most pronounced on the right at C2-3 but also noted on the  left at C3-4 and C4-5. There is mild foraminal narrowing on the left  at C3-4 and C4-5 without gross neural compression.   Assessment/Plan:  This is Cory Barker, a 82 y.o. male with: Left leg weakness/numbness - now resolved Intermittent diplopia - no evidence of MG. Sounds more consistent with dry eyes. Right 3rd digit trigger finger - unable to extend fully, tendon seen in palm to  3rd digit   Plan: -Referral to hand specialist for possible trigger finger -Recommended eye drops for dry eyes  Return to clinic as needed   Jacquelyne Balint, MD

## 2022-08-27 ENCOUNTER — Ambulatory Visit (INDEPENDENT_AMBULATORY_CARE_PROVIDER_SITE_OTHER): Payer: Self-pay | Admitting: Neurology

## 2022-08-27 ENCOUNTER — Encounter: Payer: Self-pay | Admitting: Neurology

## 2022-08-27 VITALS — BP 135/63 | HR 56 | Ht 70.0 in | Wt 209.0 lb

## 2022-08-27 DIAGNOSIS — M79644 Pain in right finger(s): Secondary | ICD-10-CM

## 2022-08-27 DIAGNOSIS — R2681 Unsteadiness on feet: Secondary | ICD-10-CM

## 2022-08-27 DIAGNOSIS — M1990 Unspecified osteoarthritis, unspecified site: Secondary | ICD-10-CM

## 2022-08-27 DIAGNOSIS — M65331 Trigger finger, right middle finger: Secondary | ICD-10-CM

## 2022-08-27 DIAGNOSIS — H532 Diplopia: Secondary | ICD-10-CM

## 2022-08-27 DIAGNOSIS — I951 Orthostatic hypotension: Secondary | ICD-10-CM

## 2022-08-27 NOTE — Patient Instructions (Addendum)
I would recommend eye drops for your eyes as dry eyes is a common cause of double vision.  I am referring you to a hand specialist at [facility] to look at the middle finger on your right hand.  Follow up with me as needed.  The physicians and staff at [facility] are committed to providing excellent care. You may receive a survey requesting feedback about your experience at our office. We strive to receive "very good" responses to the survey questions. If you feel that your experience would prevent you from giving the office a "very good " response, please contact our office to try to remedy the situation. We may be reached at [phone]. Thank you for taking the time out of your busy day to complete the survey.  Jacquelyne Balint, MD [facility]

## 2022-10-13 ENCOUNTER — Ambulatory Visit (INDEPENDENT_AMBULATORY_CARE_PROVIDER_SITE_OTHER): Payer: Medicare Other | Admitting: Family Medicine

## 2022-10-13 ENCOUNTER — Encounter: Payer: Self-pay | Admitting: Family Medicine

## 2022-10-13 VITALS — BP 128/78 | HR 53 | Temp 98.0°F | Resp 18 | Ht 70.0 in | Wt 203.4 lb

## 2022-10-13 DIAGNOSIS — E663 Overweight: Secondary | ICD-10-CM

## 2022-10-13 DIAGNOSIS — E785 Hyperlipidemia, unspecified: Secondary | ICD-10-CM

## 2022-10-13 DIAGNOSIS — Z23 Encounter for immunization: Secondary | ICD-10-CM

## 2022-10-13 DIAGNOSIS — R011 Cardiac murmur, unspecified: Secondary | ICD-10-CM | POA: Diagnosis not present

## 2022-10-13 DIAGNOSIS — Z6829 Body mass index (BMI) 29.0-29.9, adult: Secondary | ICD-10-CM | POA: Diagnosis not present

## 2022-10-13 LAB — CBC WITH DIFFERENTIAL/PLATELET
Basophils Absolute: 0 10*3/uL (ref 0.0–0.1)
Basophils Relative: 0.8 % (ref 0.0–3.0)
Eosinophils Absolute: 0.2 10*3/uL (ref 0.0–0.7)
Eosinophils Relative: 3.7 % (ref 0.0–5.0)
HCT: 44.5 % (ref 39.0–52.0)
Hemoglobin: 14.9 g/dL (ref 13.0–17.0)
Lymphocytes Relative: 33.1 % (ref 12.0–46.0)
Lymphs Abs: 2 10*3/uL (ref 0.7–4.0)
MCHC: 33.5 g/dL (ref 30.0–36.0)
MCV: 89.8 fl (ref 78.0–100.0)
Monocytes Absolute: 0.6 10*3/uL (ref 0.1–1.0)
Monocytes Relative: 10.2 % (ref 3.0–12.0)
Neutro Abs: 3.2 10*3/uL (ref 1.4–7.7)
Neutrophils Relative %: 52.2 % (ref 43.0–77.0)
Platelets: 194 10*3/uL (ref 150.0–400.0)
RBC: 4.96 Mil/uL (ref 4.22–5.81)
RDW: 13.4 % (ref 11.5–15.5)
WBC: 6.1 10*3/uL (ref 4.0–10.5)

## 2022-10-13 LAB — LIPID PANEL
Cholesterol: 201 mg/dL — ABNORMAL HIGH (ref 0–200)
HDL: 38.2 mg/dL — ABNORMAL LOW (ref >39.00)
LDL Cholesterol: 130 mg/dL — ABNORMAL HIGH (ref 0–99)
NonHDL: 163.03
Total CHOL/HDL Ratio: 5
Triglycerides: 163 mg/dL — ABNORMAL HIGH (ref 0.0–149.0)
VLDL: 32.6 mg/dL (ref 0.0–40.0)

## 2022-10-13 LAB — HEPATIC FUNCTION PANEL
ALT: 18 U/L (ref 0–53)
AST: 20 U/L (ref 0–37)
Albumin: 4.5 g/dL (ref 3.5–5.2)
Alkaline Phosphatase: 55 U/L (ref 39–117)
Bilirubin, Direct: 0.2 mg/dL (ref 0.0–0.3)
Total Bilirubin: 0.8 mg/dL (ref 0.2–1.2)
Total Protein: 7.6 g/dL (ref 6.0–8.3)

## 2022-10-13 LAB — BASIC METABOLIC PANEL
BUN: 30 mg/dL — ABNORMAL HIGH (ref 6–23)
CO2: 29 mEq/L (ref 19–32)
Calcium: 9.9 mg/dL (ref 8.4–10.5)
Chloride: 102 mEq/L (ref 96–112)
Creatinine, Ser: 0.99 mg/dL (ref 0.40–1.50)
GFR: 71.43 mL/min (ref >60.00)
Glucose, Bld: 84 mg/dL (ref 70–99)
Potassium: 4.2 mEq/L (ref 3.5–5.1)
Sodium: 139 mEq/L (ref 135–145)

## 2022-10-13 LAB — TSH: TSH: 1.4 u[IU]/mL (ref 0.35–5.50)

## 2022-10-13 MED ORDER — CLOTRIMAZOLE-BETAMETHASONE 1-0.05 % EX CREA: 1.0000 | TOPICAL_CREAM | Freq: Every day | CUTANEOUS | 1 refills | Status: AC

## 2022-10-13 NOTE — Progress Notes (Signed)
   Subjective:    Patient ID: Cory Barker, male    DOB: 1940-04-01, 82 y.o.   MRN: [id number]  HPI Hyperlipidemia- last LDL 122.  Has been attempting to control w/o medication.  No CP, SOB, abd pain, N/V.  Overweight- pt's BMI is 29.18  Pt has gained 6 lbs since last year.  Pt is playing golf 'at least 3x/week'.    Health maintenance- pt will get updated Prevnar 20   Review of Systems For ROS see HPI     Objective:   Physical Exam Vitals reviewed.  Constitutional:      General: He is not in acute distress.    Appearance: Normal appearance. He is well-developed. He is not ill-appearing.  HENT:     Head: Normocephalic and atraumatic.  Eyes:     Extraocular Movements: Extraocular movements intact.     Conjunctiva/sclera: Conjunctivae normal.     Pupils: Pupils are equal, round, and reactive to light.  Neck:     Thyroid: No thyromegaly.  Cardiovascular:     Rate and Rhythm: Normal rate and regular rhythm.     Pulses: Normal pulses.     Heart sounds: Murmur (II-III/VI SEM at RUSB) heard.  Pulmonary:     Effort: Pulmonary effort is normal. No respiratory distress.     Breath sounds: Normal breath sounds.  Abdominal:     General: Bowel sounds are normal. There is no distension.     Palpations: Abdomen is soft.  Musculoskeletal:     Cervical back: Normal range of motion and neck supple.     Right lower leg: No edema.     Left lower leg: No edema.  Lymphadenopathy:     Cervical: No cervical adenopathy.  Skin:    General: Skin is warm and dry.  Neurological:     General: No focal deficit present.     Mental Status: He is alert and oriented to person, place, and time.     Cranial Nerves: No cranial nerve deficit.  Psychiatric:        Mood and Affect: Mood normal.        Behavior: Behavior normal.           Assessment & Plan:

## 2022-10-13 NOTE — Assessment & Plan Note (Signed)
Pt has gained 6 lbs since last year.  He is still golfing multiple times weekly.  Applauded his efforts.  Check labs to risk stratify.  Will follow.

## 2022-10-13 NOTE — Patient Instructions (Signed)
Follow up in 1 year or as needed We'll notify you of your lab results and make any changes if needed We'll call you to schedule your ultrasound (ECHO) Keep up the good work on healthy diet and regular exercise- you look great! Call with any questions or concerns Enjoy the rest of your summer!!!

## 2022-10-13 NOTE — Assessment & Plan Note (Signed)
Chronic problem.  Last LDL 122.  Attempting to control w/ diet and exercise.  Check labs and see if medication is needed.

## 2022-10-13 NOTE — Assessment & Plan Note (Signed)
New.  Pt does not remember ever being told he had a murmur and I do not recall hearing one in the past.  He is asymptomatic but given that this is a III/VI SEM I feel it is best to get an ECHO to assess.  Pt expressed understanding and is in agreement w/ plan.

## 2022-10-13 NOTE — Addendum Note (Signed)
Addended byRockney Ghee on: 10/13/2022 08:14 AM   Modules accepted: Orders

## 2022-10-14 ENCOUNTER — Ambulatory Visit (HOSPITAL_BASED_OUTPATIENT_CLINIC_OR_DEPARTMENT_OTHER): Payer: Medicare Other

## 2022-10-14 ENCOUNTER — Telehealth: Payer: Self-pay

## 2022-10-14 DIAGNOSIS — R011 Cardiac murmur, unspecified: Secondary | ICD-10-CM

## 2022-10-14 LAB — ECHOCARDIOGRAM COMPLETE
AR max vel: 1.07 cm2
AV Area VTI: 1.3 cm2
AV Area mean vel: 1.08 cm2
AV Mean grad: 26 mmHg
AV Peak grad: 51.8 mmHg
Ao pk vel: 3.6 m/s
Area-P 1/2: 2.8 cm2
S' Lateral: 3.01 cm

## 2022-10-14 NOTE — Telephone Encounter (Signed)
-----   Message from Neena Rhymes sent at 10/14/2022  7:26 AM EDT ----- Labs look good w/ exception of total cholesterol and LDL (bad cholesterol)- both of which are slightly above goal.  Again, your ratio of bad to good cholesterol puts you in the average risk rage of heart disease and stroke which means we don't have to aggressively pursue this, but we could start a low dose cholesterol medication (Simvastatin 10mg  daily, #30, 3 refills) if you would like to get these numbers back in range.  If we start the cholesterol medication, we will need to repeat your liver functions at a lab only visit in 6 weeks to make sure you are metabolizing the medication properly (dx hyperlipidemia).  Just let me know how you would like to proceed.

## 2022-10-14 NOTE — Telephone Encounter (Signed)
Left pt a VM to call office in regards to lab results . The Simvastatin has not been sent in until pt agrees

## 2022-10-15 ENCOUNTER — Other Ambulatory Visit: Payer: Self-pay

## 2022-10-15 ENCOUNTER — Telehealth: Payer: Self-pay

## 2022-10-15 ENCOUNTER — Telehealth: Payer: Self-pay | Admitting: Family Medicine

## 2022-10-15 DIAGNOSIS — E785 Hyperlipidemia, unspecified: Secondary | ICD-10-CM

## 2022-10-15 MED ORDER — SIMVASTATIN 10 MG PO TABS
10.0000 mg | ORAL_TABLET | Freq: Every day | ORAL | 3 refills | Status: DC
Start: 2022-10-15 — End: 2023-01-11

## 2022-10-15 NOTE — Telephone Encounter (Signed)
Pt returned my call to give his ok to start Simvastatin 10 mg and made his 6 weeks lab only visit to repeat his BMP order is in .

## 2022-10-15 NOTE — Telephone Encounter (Signed)
Pt is aware of results. 

## 2022-10-15 NOTE — Telephone Encounter (Signed)
Looks like you discussed results with patient was there anything else you needed from this patient?

## 2022-10-15 NOTE — Telephone Encounter (Signed)
-----   Message from Neena Rhymes sent at 10/14/2022  4:30 PM EDT ----- Your ECHO overall looks good!  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  This is very normal as we age and since you are not having any symptoms, there is no need for additional work up or intervention.  Great news!

## 2022-10-15 NOTE — Telephone Encounter (Signed)
Pt is aware of lab results.

## 2022-10-15 NOTE — Telephone Encounter (Signed)
He will let me know if he chooses to try the medication

## 2022-10-15 NOTE — Telephone Encounter (Signed)
Caller name: Wilbur Oakland  On DPR?: Yes  Call back number: [phone] (home)  Provider they see: Sheliah Hatch, MD  Reason for call:  Pt returning Diamond's call

## 2022-11-11 ENCOUNTER — Telehealth: Payer: Self-pay

## 2022-11-11 DIAGNOSIS — M79644 Pain in right finger(s): Secondary | ICD-10-CM | POA: Insufficient documentation

## 2022-11-11 NOTE — Telephone Encounter (Signed)
Received note from [facility] the they were unable to get in touch with this pt. I called and left message to call them at [phone] or call us I there is a problem or concern . 11/11/22

## 2022-11-12 DIAGNOSIS — G5601 Carpal tunnel syndrome, right upper limb: Secondary | ICD-10-CM | POA: Insufficient documentation

## 2022-11-24 ENCOUNTER — Other Ambulatory Visit: Payer: Medicare Other

## 2022-11-26 ENCOUNTER — Other Ambulatory Visit (INDEPENDENT_AMBULATORY_CARE_PROVIDER_SITE_OTHER): Payer: Medicare Other

## 2022-11-26 ENCOUNTER — Other Ambulatory Visit: Payer: Self-pay

## 2022-11-26 ENCOUNTER — Ambulatory Visit (INDEPENDENT_AMBULATORY_CARE_PROVIDER_SITE_OTHER): Payer: Medicare Other

## 2022-11-26 ENCOUNTER — Other Ambulatory Visit: Payer: Self-pay | Admitting: Family Medicine

## 2022-11-26 ENCOUNTER — Telehealth: Payer: Self-pay

## 2022-11-26 DIAGNOSIS — E785 Hyperlipidemia, unspecified: Secondary | ICD-10-CM

## 2022-11-26 LAB — BASIC METABOLIC PANEL
BUN: 31 mg/dL — ABNORMAL HIGH (ref 6–23)
CO2: 27 meq/L (ref 19–32)
Calcium: 9.5 mg/dL (ref 8.4–10.5)
Chloride: 104 meq/L (ref 96–112)
Creatinine, Ser: 0.9 mg/dL (ref 0.40–1.50)
GFR: 80.01 mL/min (ref >60.00)
Glucose, Bld: 98 mg/dL (ref 70–99)
Potassium: 4.1 meq/L (ref 3.5–5.1)
Sodium: 139 meq/L (ref 135–145)

## 2022-11-26 LAB — HEPATIC FUNCTION PANEL
ALT: 16 U/L (ref 0–53)
AST: 19 U/L (ref 0–37)
Albumin: 4.1 g/dL (ref 3.5–5.2)
Alkaline Phosphatase: 54 U/L (ref 39–117)
Bilirubin, Direct: 0.1 mg/dL (ref 0.0–0.3)
Total Bilirubin: 0.8 mg/dL (ref 0.2–1.2)
Total Protein: 7.1 g/dL (ref 6.0–8.3)

## 2022-11-26 NOTE — Telephone Encounter (Signed)
-----   Message from Neena Rhymes sent at 11/26/2022  1:54 PM EDT ----- Pt was supposed to have LFTs done after starting cholesterol medication.  Is there any way to add this to the BMP that was drawn?

## 2022-11-26 NOTE — Telephone Encounter (Signed)
Correction LFT ADD ON SENT TO [facility]

## 2022-11-29 ENCOUNTER — Telehealth: Payer: Self-pay

## 2022-11-29 NOTE — Telephone Encounter (Signed)
-----   Message from Neena Rhymes sent at 11/29/2022  7:35 AM EDT ----- Liver functions are normal.  Please continue your cholesterol medication

## 2022-12-13 ENCOUNTER — Encounter: Payer: Self-pay | Admitting: Family Medicine

## 2022-12-13 ENCOUNTER — Ambulatory Visit (INDEPENDENT_AMBULATORY_CARE_PROVIDER_SITE_OTHER): Payer: Medicare Other | Admitting: Family Medicine

## 2022-12-13 VITALS — BP 130/66 | HR 54 | Temp 98.0°F | Wt 206.0 lb

## 2022-12-13 DIAGNOSIS — R058 Other specified cough: Secondary | ICD-10-CM

## 2022-12-13 MED ORDER — PREDNISONE 10 MG PO TABS: ORAL_TABLET | ORAL | 0 refills | Status: DC

## 2022-12-13 NOTE — Patient Instructions (Signed)
Follow up as needed or as scheduled START the Prednisone as directed- 3 pills at the same time x3 days, then 2 pills at the same time x3 days, then 1 pill daily.  Take w/ food  Make sure you are drinking fluids and resting when needed Cough drops or OTC cough syrups if needed Call with any questions or concerns Hang in there!!!

## 2022-12-13 NOTE — Progress Notes (Signed)
   Subjective:    Patient ID: Cory Barker, male    DOB: [birth year], 82 y.o.   MRN: [id number]  HPI Cough- pt reports sxs started 'a couple of weeks ago'.  He describes cough as 'raspy', non productive.  No fever.  Denies body aches.  No HA.  Denies nasal congestion.  No known sick contacts.  Pt reports that without the cough, he would otherwise feel well.  Did not feel particularly well when sxs started a few weeks ago.  No SOB.   Review of Systems For ROS see HPI     Objective:   Physical Exam Vitals reviewed.  Constitutional:      General: He is not in acute distress.    Appearance: Normal appearance. He is not ill-appearing.  HENT:     Head: Normocephalic and atraumatic.     Right Ear: Tympanic membrane and ear canal normal.     Left Ear: Tympanic membrane and ear canal normal.     Nose: No congestion.     Comments: No TTP over frontal or maxillary sinuses    Mouth/Throat:     Pharynx: Oropharynx is clear. No oropharyngeal exudate or posterior oropharyngeal erythema.  Cardiovascular:     Rate and Rhythm: Normal rate and regular rhythm.     Heart sounds: Murmur (II/VI diastolic murmur) heard.  Pulmonary:     Effort: Pulmonary effort is normal. No respiratory distress.     Breath sounds: Normal breath sounds. No wheezing, rhonchi or rales.     Comments: + dry cough Lymphadenopathy:     Cervical: No cervical adenopathy.  Skin:    General: Skin is warm and dry.  Neurological:     General: No focal deficit present.     Mental Status: He is alert and oriented to person, place, and time.           Assessment & Plan:  Post viral cough- new.  Pt is otherwise well but has a dry, 'irritating' cough.  Lungs are CTAB.  Denies SOB or wheezing.  Will start Prednisone to improve airway inflammation.  No need for abx at this time.  Pt expressed understanding and is in agreement w/ plan.

## 2022-12-30 ENCOUNTER — Ambulatory Visit: Payer: Medicare Other | Admitting: *Deleted

## 2022-12-30 DIAGNOSIS — Z0000 Encounter for general adult medical examination without abnormal findings: Secondary | ICD-10-CM

## 2022-12-30 NOTE — Patient Instructions (Signed)
Mr. Cory Barker , Thank you for taking time to come for your Medicare Wellness Visit. I appreciate your ongoing commitment to your health goals. Please review the following plan we discussed and let me know if I can assist you in the future.   Screening recommendations/referrals: Colonoscopy: no longer required Recommended yearly ophthalmology/optometry visit for glaucoma screening and checkup Recommended yearly dental visit for hygiene and checkup  Vaccinations: Influenza vaccine: Education provided Pneumococcal vaccine: Education provided Tdap vaccine: Education provided Shingles vaccine: Education provided    Advanced directives:  Education provided    Preventive Care 65 Years and Older, Male Preventive care refers to lifestyle choices and visits with your health care provider that can promote health and wellness. What does preventive care include? A yearly physical exam. This is also called an annual well check. Dental exams once or twice a year. Routine eye exams. Ask your health care provider how often you should have your eyes checked. Personal lifestyle choices, including: Daily care of your teeth and gums. Regular physical activity. Eating a healthy diet. Avoiding tobacco and drug use. Limiting alcohol use. Practicing safe sex. Taking low doses of aspirin every day. Taking vitamin and mineral supplements as recommended by your health care provider. What happens during an annual well check? The services and screenings done by your health care provider during your annual well check will depend on your age, overall health, lifestyle risk factors, and family history of disease. Counseling  Your health care provider may ask you questions about your: Alcohol use. Tobacco use. Drug use. Emotional well-being. Home and relationship well-being. Sexual activity. Eating habits. History of falls. Memory and ability to understand (cognition). Work and work Astronomer. Screening   You may have the following tests or measurements: Height, weight, and BMI. Blood pressure. Lipid and cholesterol levels. These may be checked every 5 years, or more frequently if you are over 78 years old. Skin check. Lung cancer screening. You may have this screening every year starting at age 22 if you have a 30-pack-year history of smoking and currently smoke or have quit within the past 15 years. Fecal occult blood test (FOBT) of the stool. You may have this test every year starting at age 44. Flexible sigmoidoscopy or colonoscopy. You may have a sigmoidoscopy every 5 years or a colonoscopy every 10 years starting at age 67. Prostate cancer screening. Recommendations will vary depending on your family history and other risks. Hepatitis C blood test. Hepatitis B blood test. Sexually transmitted disease (STD) testing. Diabetes screening. This is done by checking your blood sugar (glucose) after you have not eaten for a while (fasting). You may have this done every 1-3 years. Abdominal aortic aneurysm (AAA) screening. You may need this if you are a current or former smoker. Osteoporosis. You may be screened starting at age 83 if you are at high risk. Talk with your health care provider about your test results, treatment options, and if necessary, the need for more tests. Vaccines  Your health care provider may recommend certain vaccines, such as: Influenza vaccine. This is recommended every year. Tetanus, diphtheria, and acellular pertussis (Tdap, Td) vaccine. You may need a Td booster every 10 years. Zoster vaccine. You may need this after age 26. Pneumococcal 13-valent conjugate (PCV13) vaccine. One dose is recommended after age 28. Pneumococcal polysaccharide (PPSV23) vaccine. One dose is recommended after age 40. Talk to your health care provider about which screenings and vaccines you need and how often you need them. This information is  not intended to replace advice given to you by  your health care provider. Make sure you discuss any questions you have with your health care provider. Document Released: 03/28/2015 Document Revised: 11/19/2015 Document Reviewed: 12/31/2014 Elsevier Interactive Patient Education  2017 [employer].  Fall Prevention in the Home Falls can cause injuries. They can happen to people of all ages. There are many things you can do to make your home safe and to help prevent falls. What can I do on the outside of my home? Regularly fix the edges of walkways and driveways and fix any cracks. Remove anything that might make you trip as you walk through a door, such as a raised step or threshold. Trim any bushes or trees on the path to your home. Use bright outdoor lighting. Clear any walking paths of anything that might make someone trip, such as rocks or tools. Regularly check to see if handrails are loose or broken. Make sure that both sides of any steps have handrails. Any raised decks and porches should have guardrails on the edges. Have any leaves, snow, or ice cleared regularly. Use sand or salt on walking paths during winter. Clean up any spills in your garage right away. This includes oil or grease spills. What can I do in the bathroom? Use night lights. Install grab bars by the toilet and in the tub and shower. Do not use towel bars as grab bars. Use non-skid mats or decals in the tub or shower. If you need to sit down in the shower, use a plastic, non-slip stool. Keep the floor dry. Clean up any water that spills on the floor as soon as it happens. Remove soap buildup in the tub or shower regularly. Attach bath mats securely with double-sided non-slip rug tape. Do not have throw rugs and other things on the floor that can make you trip. What can I do in the bedroom? Use night lights. Make sure that you have a light by your bed that is easy to reach. Do not use any sheets or blankets that are too big for your bed. They should not hang  down onto the floor. Have a firm chair that has side arms. You can use this for support while you get dressed. Do not have throw rugs and other things on the floor that can make you trip. What can I do in the kitchen? Clean up any spills right away. Avoid walking on wet floors. Keep items that you use a lot in easy-to-reach places. If you need to reach something above you, use a strong step stool that has a grab bar. Keep electrical cords out of the way. Do not use floor polish or wax that makes floors slippery. If you must use wax, use non-skid floor wax. Do not have throw rugs and other things on the floor that can make you trip. What can I do with my stairs? Do not leave any items on the stairs. Make sure that there are handrails on both sides of the stairs and use them. Fix handrails that are broken or loose. Make sure that handrails are as long as the stairways. Check any carpeting to make sure that it is firmly attached to the stairs. Fix any carpet that is loose or worn. Avoid having throw rugs at the top or bottom of the stairs. If you do have throw rugs, attach them to the floor with carpet tape. Make sure that you have a light switch at the top of the  stairs and the bottom of the stairs. If you do not have them, ask someone to add them for you. What else can I do to help prevent falls? Wear shoes that: Do not have high heels. Have rubber bottoms. Are comfortable and fit you well. Are closed at the toe. Do not wear sandals. If you use a stepladder: Make sure that it is fully opened. Do not climb a closed stepladder. Make sure that both sides of the stepladder are locked into place. Ask someone to hold it for you, if possible. Clearly mark and make sure that you can see: Any grab bars or handrails. First and last steps. Where the edge of each step is. Use tools that help you move around (mobility aids) if they are needed. These  include: Canes. Walkers. Scooters. Crutches. Turn on the lights when you go into a dark area. Replace any light bulbs as soon as they burn out. Set up your furniture so you have a clear path. Avoid moving your furniture around. If any of your floors are uneven, fix them. If there are any pets around you, be aware of where they are. Review your medicines with your doctor. Some medicines can make you feel dizzy. This can increase your chance of falling. Ask your doctor what other things that you can do to help prevent falls. This information is not intended to replace advice given to you by your health care provider. Make sure you discuss any questions you have with your health care provider. Document Released: 12/26/2008 Document Revised: 08/07/2015 Document Reviewed: 04/05/2014 Elsevier Interactive Patient Education  2017 [employer].

## 2022-12-30 NOTE — Progress Notes (Signed)
Subjective:   Cory Barker is a 82 y.o. male who presents for Medicare Annual/Subsequent preventive examination.  Visit Complete: Virtual I connected with  Cory Barker on 12/30/22 by a audio enabled telemedicine application and verified that I am speaking with the correct person using two identifiers.  Patient Location: Home  Provider Location: Home Office  I discussed the limitations of evaluation and management by telemedicine. The patient expressed understanding and agreed to proceed.  Vital Signs: Because this visit was a virtual/telehealth visit, some criteria may be missing or patient reported. Any vitals not documented were not able to be obtained and vitals that have been documented are patient reported.   Cardiac Risk Factors include: advanced age (>88men, >84 women);male gender     Objective:    There were no vitals filed for this visit. There is no height or weight on file to calculate BMI.     12/30/2022   10:53 AM 08/27/2022    8:47 AM 02/24/2022   10:10 AM 12/22/2021    9:26 AM 12/17/2021    8:49 AM 10/01/2019    8:09 AM 09/27/2018    8:03 AM  Advanced Directives  Does Patient Have a Medical Advance Directive? No Yes No No No No No  Type of Furniture conservator/restorer;Out of facility DNR (pink MOST or yellow form);Living will       Would patient like information on creating a medical advance directive? No - Patient declined    No - Patient declined Yes (MAU/Ambulatory/Procedural Areas - Information given) Yes (MAU/Ambulatory/Procedural Areas - Information given)    Current Medications (verified) Outpatient Encounter Medications as of 12/30/2022  Medication Sig   Calcium Carb-Cholecalciferol (CALCIUM 1000 + D PO) Take 1 tablet by mouth daily.   clotrimazole-betamethasone (LOTRISONE) cream Apply 1 Application topically daily.   glucosamine-chondroitin 500-400 MG tablet Take 1 tablet by mouth daily.   NON FORMULARY Focus Factor   simvastatin  (ZOCOR) 10 MG tablet Take 1 tablet (10 mg total) by mouth at bedtime.   predniSONE (DELTASONE) 10 MG tablet 3 tabs x3 days and then 2 tabs x3 days and then 1 tab x3 days.  Take w/ food.   vitamin B-12 (CYANOCOBALAMIN) 100 MCG tablet Take 100 mcg by mouth in the morning and at bedtime.   No facility-administered encounter medications on file as of 12/30/2022.    Allergies (verified) Penicillins   History: Past Medical History:  Diagnosis Date   Allergy    Arthritis    Cancer (HCC)    skin cancers   Colon polyps    Diverticulosis    Hemorrhoids    Hyperlipidemia    Osteopenia    Osteoporosis    Past Surgical History:  Procedure Laterality Date   COLONOSCOPY     KNEE SURGERY     bilateral   TONSILLECTOMY     Family History  Problem Relation Age of Onset   Heart attack Mother    Lung cancer Father 79   Breast cancer Sister    Colon cancer Neg Hx    Social History   Socioeconomic History   Marital status: Married    Spouse name: Not on file   Number of children: 2   Years of education: Not on file   Highest education level: Not on file  Occupational History   Occupation: Retired  Tobacco Use   Smoking status: Former    Current packs/day: 0.00    Types: Cigarettes    Quit date:  03/16/1971    Years since quitting: 51.8   Smokeless tobacco: Never  Vaping Use   Vaping status: Never Used  Substance and Sexual Activity   Alcohol use: Yes    Alcohol/week: 0.0 standard drinks of alcohol    Comment: rarely   Drug use: No   Sexual activity: Not Currently  Other Topics Concern   Not on file  Social History Narrative   Left handed    Caffeine rarely   Live in a two story home   Social Determinants of Health   Financial Resource Strain: Low Risk  (12/30/2022)   Overall Financial Resource Strain (CARDIA)    Difficulty of Paying Living Expenses: Not hard at all  Food Insecurity: No Food Insecurity (12/30/2022)   Hunger Vital Sign    Worried About Running Out of  Food in the Last Year: Never true    Ran Out of Food in the Last Year: Never true  Transportation Needs: No Transportation Needs (12/30/2022)   PRAPARE - Administrator, Civil Service (Medical): No    Lack of Transportation (Non-Medical): No  Physical Activity: Sufficiently Active (12/30/2022)   Exercise Vital Sign    Days of Exercise per Week: 4 days    Minutes of Exercise per Session: 40 min  Stress: No Stress Concern Present (12/30/2022)   [employer] of Occupational Health - Occupational Stress Questionnaire    Feeling of Stress : Not at all  Social Connections: Socially Integrated (12/30/2022)   Social Connection and Isolation Panel [NHANES]    Frequency of Communication with Friends and Family: More than three times a week    Frequency of Social Gatherings with Friends and Family: Three times a week    Attends Religious Services: More than 4 times per year    Active Member of Clubs or Organizations: Yes    Attends Banker Meetings: 1 to 4 times per year    Marital Status: Married    Tobacco Counseling Counseling given: Not Answered   Clinical Intake:  Pre-visit preparation completed: Yes  Pain : No/denies pain     Diabetes: No  How often do you need to have someone help you when you read instructions, pamphlets, or other written materials from your doctor or pharmacy?: 1 - Never  Interpreter Needed?: No  Information entered by :: Remi Haggard LPN   Activities of Daily Living    12/30/2022   10:54 AM 10/13/2022    7:42 AM  In your present state of health, do you have any difficulty performing the following activities:  Hearing? 0 0  Vision? 0 0  Difficulty concentrating or making decisions? 0 0  Walking or climbing stairs? 0 0  Dressing or bathing? 0 0  Doing errands, shopping? 0 0  Preparing Food and eating ? N   Using the Toilet? N   In the past six months, have you accidently leaked urine? N   Do you have problems with  loss of bowel control? N   Managing your Medications? N     Patient Care Team: Sheliah Hatch, MD as PCP - General (Family Medicine) Hilarie Fredrickson, MD as Consulting Physician (Gastroenterology) [facility], Kirstie Peri, DPM as Consulting Physician (Podiatry) Loleta Chance, Manus Gunning, MD as Consulting Physician (Neurology)  Indicate any recent Medical Services you may have received from other than [facility] providers in the past year (date may be approximate).     Assessment:   This is a routine wellness  examination for [place].  Hearing/Vision screen Hearing Screening - Comments:: No trouble hearing Vision Screening - Comments:: Up to date  [facility]   Goals Addressed             This Visit's Progress    Weight (lb) < 200 lb (90.7 kg)         Depression Screen    12/30/2022   10:52 AM 12/13/2022    1:29 PM 10/13/2022    7:41 AM 12/17/2021    8:48 AM 10/09/2021    8:42 AM 12/31/2020    1:44 PM 10/01/2019    8:11 AM  PHQ 2/9 Scores  PHQ - 2 Score 0 0 0 0 1 0 0  PHQ- 9 Score 0 3 3  4 2      Fall Risk    12/30/2022   10:48 AM 12/13/2022    1:29 PM 10/13/2022    7:42 AM 08/27/2022    8:47 AM 02/24/2022   10:10 AM  Fall Risk   Falls in the past year? 0 0 0 0 0  Number falls in past yr: 0 0 0 0 0  Injury with Fall? 0 0 0 0 0  Risk for fall due to :  No Fall Risks No Fall Risks    Follow up Falls evaluation completed;Education provided;Falls prevention discussed Falls evaluation completed Falls evaluation completed Falls evaluation completed Falls evaluation completed    MEDICARE RISK AT HOME: Medicare Risk at Home Any stairs in or around the home?: Yes If so, are there any without handrails?: No Home free of loose throw rugs in walkways, pet beds, electrical cords, etc?: Yes Adequate lighting in your home to reduce risk of falls?: Yes Life alert?: No Use of a cane, walker or w/c?: No Grab bars in the bathroom?: Yes Shower chair or bench in shower?: No Elevated  toilet seat or a handicapped toilet?: No  TIMED UP AND GO:  Was the test performed?  No    Cognitive Function:    09/27/2018    8:06 AM 09/21/2017    3:38 PM  MMSE - Mini Mental State Exam  Orientation to time 5 5  Orientation to Place 5 5  Registration 3 3  Attention/ Calculation 5 5  Recall 1 0  Language- name 2 objects 2 2  Language- repeat 1 1  Language- follow 3 step command 3 3  Language- read & follow direction 1 1  Write a sentence 1 1  Copy design 1 1  Total score 28 27        12/30/2022   10:52 AM 12/17/2021    8:50 AM  6CIT Screen  What Year? 0 points 0 points  What month? 0 points 0 points  What time? 0 points 0 points  Count back from 20 0 points 0 points  Months in reverse 0 points 0 points  Repeat phrase 0 points 0 points  Total Score 0 points 0 points    Immunizations Immunization History  Administered Date(s) Administered   Moderna Sars-Covid-2 Vaccination 05/03/2019, 05/31/2019   PFIZER(Purple Top)SARS-COV-2 Vaccination 03/26/2020   PNEUMOCOCCAL CONJUGATE-20 10/13/2022   Pneumococcal Polysaccharide-23 11/23/2006    TDAP status: Due, Education has been provided regarding the importance of this vaccine. Advised may receive this vaccine at local pharmacy or Health Dept. Aware to provide a copy of the vaccination record if obtained from local pharmacy or Health Dept. Verbalized acceptance and understanding.  Flu Vaccine status: Due, Education has been provided regarding the importance of  this vaccine. Advised may receive this vaccine at local pharmacy or Health Dept. Aware to provide a copy of the vaccination record if obtained from local pharmacy or Health Dept. Verbalized acceptance and understanding.  Pneumococcal vaccine status: Due, Education has been provided regarding the importance of this vaccine. Advised may receive this vaccine at local pharmacy or Health Dept. Aware to provide a copy of the vaccination record if obtained from local pharmacy  or Health Dept. Verbalized acceptance and understanding.  Covid-19 vaccine status: Declined, Education has been provided regarding the importance of this vaccine but patient still declined. Advised may receive this vaccine at local pharmacy or Health Dept.or vaccine clinic. Aware to provide a copy of the vaccination record if obtained from local pharmacy or Health Dept. Verbalized acceptance and understanding.  Qualifies for Shingles Vaccine? Yes   Zostavax completed No   Shingrix Completed?: No.    Education has been provided regarding the importance of this vaccine. Patient has been advised to call insurance company to determine out of pocket expense if they have not yet received this vaccine. Advised may also receive vaccine at local pharmacy or Health Dept. Verbalized acceptance and understanding.  Screening Tests Health Maintenance  Topic Date Due   INFLUENZA VACCINE  06/13/2023 (Originally 10/14/2022)   Medicare Annual Wellness (AWV)  12/30/2023   HPV VACCINES  Aged Out   DTaP/Tdap/Td  Discontinued   Pneumonia Vaccine 27+ Years old  Discontinued   Colonoscopy  Discontinued   COVID-19 Vaccine  Discontinued   Zoster Vaccines- Shingrix  Discontinued    Health Maintenance  There are no preventive care reminders to display for this patient.   Colorectal cancer screening: No longer required.   Lung Cancer Screening: (Low Dose CT Chest recommended if Age 54-80 years, 20 pack-year currently smoking OR have quit w/in 15years.) does not qualify.   Lung Cancer Screening Referral:   Additional Screening:  Hepatitis C Screening: Never done  Vision Screening: Recommended annual ophthalmology exams for early detection of glaucoma and other disorders of the eye. Is the patient up to date with their annual eye exam?  Yes  Who is the provider or what is the name of the office in which the patient attends annual eye exams? [facility] every two years If pt is not established with a provider, would  they like to be referred to a provider to establish care? No .   Dental Screening: Recommended annual dental exams for proper oral hygiene    Community Resource Referral / Chronic Care Management: CRR required this visit?  No   CCM required this visit?  No     Plan:     I have personally reviewed and noted the following in the patient's chart:   Medical and social history Use of alcohol, tobacco or illicit drugs  Current medications and supplements including opioid prescriptions. Patient is not currently taking opioid prescriptions. Functional ability and status Nutritional status Physical activity Advanced directives List of other physicians Hospitalizations, surgeries, and ER visits in previous 12 months Vitals Screenings to include cognitive, depression, and falls Referrals and appointments  In addition, I have reviewed and discussed with patient certain preventive protocols, quality metrics, and best practice recommendations. A written personalized care plan for preventive services as well as general preventive health recommendations were provided to patient.     Remi Haggard, LPN   16/12/9602   After Visit Summary: (MyChart) Due to this being a telephonic visit, the after visit summary with patients personalized plan was  offered to patient via MyChart   Nurse Notes:

## 2023-01-11 ENCOUNTER — Other Ambulatory Visit: Payer: Self-pay | Admitting: Family Medicine

## 2023-01-11 DIAGNOSIS — E785 Hyperlipidemia, unspecified: Secondary | ICD-10-CM

## 2023-08-29 ENCOUNTER — Ambulatory Visit: Payer: Self-pay

## 2023-08-29 NOTE — Telephone Encounter (Signed)
 FYI Only or Action Required?: FYI only for provider  Patient was last seen in primary care on 12/13/2022 by Jess Morita, MD. Called Nurse Triage reporting Cough. Symptoms began several days ago. Interventions attempted: Nothing. Symptoms are: gradually worsening.  Triage Disposition: Home Care  Patient/caregiver understands and will follow disposition?: Yes                    Copied from CRM [id number]. Topic: Clinical - Red Word Triage >> Aug 29, 2023  9:18 AM Cory Barker wrote: Red Word that prompted transfer to Nurse Triage: patient states he get into these convulsions to when he coughs so much his side hurts Reason for Disposition  Cough  Answer Assessment - Initial Assessment Questions 1. ONSET: When did the cough begin?      3 days 2. SEVERITY: How bad is the cough today?      severe 3. SPUTUM: Describe the color of your sputum (none, dry cough; clear, white, yellow, green)     yellow 4. HEMOPTYSIS: Are you coughing up any blood? If so ask: How much? (flecks, streaks, tablespoons, etc.)     no 5. DIFFICULTY BREATHING: Are you having difficulty breathing? If Yes, ask: How bad is it? (e.g., mild, moderate, severe)    - MILD: No SOB at rest, mild SOB with walking, speaks normally in sentences, can lie down, no retractions, pulse < 100.    - MODERATE: SOB at rest, SOB with minimal exertion and prefers to sit, cannot lie down flat, speaks in phrases, mild retractions, audible wheezing, pulse 100-120.    - SEVERE: Very SOB at rest, speaks in single words, struggling to breathe, sitting hunched forward, retractions, pulse > 120      no 6. FEVER: Do you have a fever? If Yes, ask: What is your temperature, how was it measured, and when did it start?       no 7. CARDIAC HISTORY: Do you have any history of heart disease? (e.g., heart attack, congestive heart failure)      no 8. LUNG HISTORY: Do you have any history of lung disease?  (e.g., pulmonary  embolus, asthma, emphysema)     no 9. PE RISK FACTORS: Do you have a history of blood clots? (or: recent major surgery, recent prolonged travel, bedridden)     no 10. OTHER SYMPTOMS: Do you have any other symptoms? (e.g., runny nose, wheezing, chest pain)       Sweating, runny nose  Protocols used: Cough - Acute Productive-A-AH

## 2023-08-29 NOTE — Telephone Encounter (Signed)
 Called left vm for pt to make appt to be seen. If PCP does not have opening please schedule at another office

## 2023-08-30 ENCOUNTER — Ambulatory Visit: Admitting: Family Medicine

## 2023-08-30 NOTE — Telephone Encounter (Signed)
 Called and LM to call back to make and appt

## 2023-08-31 ENCOUNTER — Ambulatory Visit (INDEPENDENT_AMBULATORY_CARE_PROVIDER_SITE_OTHER): Admitting: Family Medicine

## 2023-08-31 ENCOUNTER — Encounter: Payer: Self-pay | Admitting: Family Medicine

## 2023-08-31 VITALS — BP 132/68 | HR 68 | Temp 98.0°F | Ht 70.0 in | Wt 203.0 lb

## 2023-08-31 DIAGNOSIS — R0989 Other specified symptoms and signs involving the circulatory and respiratory systems: Secondary | ICD-10-CM | POA: Diagnosis not present

## 2023-08-31 DIAGNOSIS — R062 Wheezing: Secondary | ICD-10-CM | POA: Diagnosis not present

## 2023-08-31 MED ORDER — ALBUTEROL SULFATE HFA 108 (90 BASE) MCG/ACT IN AERS: 2.0000 | INHALATION_SPRAY | Freq: Four times a day (QID) | RESPIRATORY_TRACT | 0 refills | Status: DC | PRN

## 2023-08-31 MED ORDER — AZITHROMYCIN 250 MG PO TABS: ORAL_TABLET | ORAL | 0 refills | Status: AC

## 2023-08-31 NOTE — Patient Instructions (Signed)
 Follow up as needed or as scheduled START the Azithromycin as directed- 2 tabs today and then 1 daily USE the Albuterol inhaler- 2 puffs every 4 hrs as needed for cough or wheeze REST! Make sure you are drinking plenty of fluids Call with any questions or concerns Hang in there! HAPPY ANNIVERSARY!!!

## 2023-08-31 NOTE — Progress Notes (Signed)
   Subjective:    Patient ID: Cory Barker, male    DOB: [birth year], 83 y.o.   MRN: [id number]  HPI Cough- sxs started ~1 week ago.  Cough is intermittently productive.  No fever but has had some sweats.  Denies sore throat.  + nasal congestion, denies sinus pain.  Denies SOB.  Denies crackles or wheezes.  Is waking up at night coughing.  No known sick contacts.  Sxs had been worsening until this morning and now seem somewhat better.   Review of Systems For ROS see HPI     Objective:   Physical Exam Vitals reviewed.  Constitutional:      General: He is not in acute distress. HENT:     Head: Normocephalic and atraumatic.     Right Ear: Tympanic membrane and ear canal normal.     Left Ear: Tympanic membrane and ear canal normal.     Nose: Congestion present.     Comments: No TTP over frontal or maxillary sinuses  Eyes:     Extraocular Movements: Extraocular movements intact.     Conjunctiva/sclera: Conjunctivae normal.    Cardiovascular:     Rate and Rhythm: Normal rate and regular rhythm.     Heart sounds: Murmur heard.  Pulmonary:     Effort: Pulmonary effort is normal. No respiratory distress.     Breath sounds: Wheezing (scattered inspiratory and expiratory wheezes) and rhonchi (LLL crackles) present.   Musculoskeletal:     Cervical back: Neck supple.  Lymphadenopathy:     Cervical: No cervical adenopathy.   Skin:    General: Skin is warm and dry.   Neurological:     General: No focal deficit present.     Mental Status: He is alert and oriented to person, place, and time.           Assessment & Plan:  Wheezing/crackles- new.  Pt's lung exam is consistent w/ infxn.  Start Zpack and albuterol inhaler.  Reviewed supportive care and red flags that should prompt return.  Pt expressed understanding and is in agreement w/ plan.

## 2023-09-06 ENCOUNTER — Ambulatory Visit: Admitting: Family Medicine

## 2023-09-08 ENCOUNTER — Encounter: Payer: Self-pay | Admitting: Family Medicine

## 2023-09-27 ENCOUNTER — Other Ambulatory Visit: Payer: Self-pay | Admitting: Family Medicine

## 2023-10-14 ENCOUNTER — Ambulatory Visit: Payer: Medicare Other | Admitting: Family Medicine

## 2023-10-17 ENCOUNTER — Encounter: Payer: Self-pay | Admitting: Family Medicine

## 2023-10-17 ENCOUNTER — Ambulatory Visit (INDEPENDENT_AMBULATORY_CARE_PROVIDER_SITE_OTHER): Payer: Medicare Other | Admitting: Family Medicine

## 2023-10-17 VITALS — BP 124/62 | HR 73 | Temp 98.0°F | Ht 70.0 in | Wt 205.4 lb

## 2023-10-17 DIAGNOSIS — E663 Overweight: Secondary | ICD-10-CM

## 2023-10-17 DIAGNOSIS — R0981 Nasal congestion: Secondary | ICD-10-CM | POA: Diagnosis not present

## 2023-10-17 DIAGNOSIS — E785 Hyperlipidemia, unspecified: Secondary | ICD-10-CM | POA: Diagnosis not present

## 2023-10-17 LAB — LIPID PANEL
Cholesterol: 189 mg/dL (ref 0–200)
HDL: 38.4 mg/dL — ABNORMAL LOW (ref >39.00)
LDL Cholesterol: 113 mg/dL — ABNORMAL HIGH (ref 0–99)
NonHDL: 150.36
Total CHOL/HDL Ratio: 5
Triglycerides: 186 mg/dL — ABNORMAL HIGH (ref 0.0–149.0)
VLDL: 37.2 mg/dL (ref 0.0–40.0)

## 2023-10-17 LAB — BASIC METABOLIC PANEL WITH GFR
BUN: 27 mg/dL — ABNORMAL HIGH (ref 6–23)
CO2: 25 meq/L (ref 19–32)
Calcium: 9.6 mg/dL (ref 8.4–10.5)
Chloride: 103 meq/L (ref 96–112)
Creatinine, Ser: 0.84 mg/dL (ref 0.40–1.50)
GFR: 81.19 mL/min (ref >60.00)
Glucose, Bld: 94 mg/dL (ref 70–99)
Potassium: 4.1 meq/L (ref 3.5–5.1)
Sodium: 138 meq/L (ref 135–145)

## 2023-10-17 LAB — CBC WITH DIFFERENTIAL/PLATELET
Basophils Absolute: 0 K/uL (ref 0.0–0.1)
Basophils Relative: 0.8 % (ref 0.0–3.0)
Eosinophils Absolute: 0.3 K/uL (ref 0.0–0.7)
Eosinophils Relative: 4.7 % (ref 0.0–5.0)
HCT: 41.9 % (ref 39.0–52.0)
Hemoglobin: 14.4 g/dL (ref 13.0–17.0)
Lymphocytes Relative: 35.4 % (ref 12.0–46.0)
Lymphs Abs: 2.1 K/uL (ref 0.7–4.0)
MCHC: 34.4 g/dL (ref 30.0–36.0)
MCV: 87.5 fl (ref 78.0–100.0)
Monocytes Absolute: 0.6 K/uL (ref 0.1–1.0)
Monocytes Relative: 10.6 % (ref 3.0–12.0)
Neutro Abs: 2.8 K/uL (ref 1.4–7.7)
Neutrophils Relative %: 48.5 % (ref 43.0–77.0)
Platelets: 174 K/uL (ref 150.0–400.0)
RBC: 4.78 Mil/uL (ref 4.22–5.81)
RDW: 13.5 % (ref 11.5–15.5)
WBC: 5.8 K/uL (ref 4.0–10.5)

## 2023-10-17 LAB — HEPATIC FUNCTION PANEL
ALT: 17 U/L (ref 0–53)
AST: 21 U/L (ref 0–37)
Albumin: 4.3 g/dL (ref 3.5–5.2)
Alkaline Phosphatase: 53 U/L (ref 39–117)
Bilirubin, Direct: 0.1 mg/dL (ref 0.0–0.3)
Total Bilirubin: 0.6 mg/dL (ref 0.2–1.2)
Total Protein: 7.3 g/dL (ref 6.0–8.3)

## 2023-10-17 LAB — TSH: TSH: 1.53 u[IU]/mL (ref 0.35–5.50)

## 2023-10-17 NOTE — Progress Notes (Signed)
   Subjective:    Patient ID: Cory Barker, male    DOB: 1940-10-29, 83 y.o.   MRN: [id number]  HPI Hyperlipidemia- chronic problem, on Simvastatin  10mg  nightly.  Continues to golf regularly.  No CP, SOB, abd pain, N/V.  Congestion- sxs started ~3 days ago.  No fever, chills, body aches.  Denies HA.  + mild cough.  Overweight- weight is stable.  BMI 29.47   Review of Systems For ROS see HPI     Objective:   Physical Exam Vitals reviewed.  Constitutional:      General: He is not in acute distress.    Appearance: Normal appearance. He is well-developed. He is not ill-appearing.  HENT:     Head: Normocephalic and atraumatic.  Eyes:     Extraocular Movements: Extraocular movements intact.     Conjunctiva/sclera: Conjunctivae normal.     Pupils: Pupils are equal, round, and reactive to light.  Neck:     Thyroid : No thyromegaly.  Cardiovascular:     Rate and Rhythm: Normal rate and regular rhythm.     Pulses: Normal pulses.     Heart sounds: Murmur (II/VI SEM) heard.  Pulmonary:     Effort: Pulmonary effort is normal. No respiratory distress.     Breath sounds: Normal breath sounds.  Abdominal:     General: Bowel sounds are normal. There is no distension.     Palpations: Abdomen is soft.  Musculoskeletal:     Cervical back: Normal range of motion and neck supple.     Right lower leg: No edema.     Left lower leg: No edema.  Lymphadenopathy:     Cervical: No cervical adenopathy.  Skin:    General: Skin is warm and dry.  Neurological:     General: No focal deficit present.     Mental Status: He is alert and oriented to person, place, and time.     Cranial Nerves: No cranial nerve deficit.  Psychiatric:        Mood and Affect: Mood normal.        Behavior: Behavior normal.           Assessment & Plan:  Nasal congestion- new.  No evidence of bacterial infxn on PE.  Suspect some allergy congestion due to visible PND.  Start daily Claritin or Zyrtec prn.  Pt expressed  understanding and is in agreement w/ plan.

## 2023-10-17 NOTE — Patient Instructions (Signed)
 Follow up in 6 months to recheck cholesterol We'll notify you of your lab results and make any changes if needed Continue to work on healthy diet and regular exercise- you look great! Try adding a daily Claritin or Zyrtec until the congestion improves Call with any questions or concerns Stay Safe!  Stay Healthy!

## 2023-10-17 NOTE — Assessment & Plan Note (Signed)
 Ongoing issue.  Weight is stable and BMI is 29.47.  He was previously walking 5 miles/day and reports w/ the heat this has decreased to ~1 mile.  Plays golf regularly.  Applauded his efforts to remain active.  Will continue to follow.

## 2023-10-17 NOTE — Assessment & Plan Note (Signed)
 Chronic problem.  On Simvastatin  10mg  nightly w/o difficulty.  Continues to be very active.  Check labs.  Adjust meds prn

## 2023-10-18 ENCOUNTER — Ambulatory Visit: Payer: Self-pay | Admitting: Family Medicine

## 2023-12-26 ENCOUNTER — Ambulatory Visit: Payer: Self-pay

## 2023-12-26 NOTE — Telephone Encounter (Signed)
 FYI Only or Action Required?: FYI only for provider.  Patient was last seen in primary care on 10/17/2023 by Cory Comer BRAVO, MD.  Called Nurse Triage reporting Neck Pain.  Symptoms began several days ago.  Interventions attempted: OTC medications: ibuprofen.  Symptoms are: gradually improving.  Triage Disposition: See PCP When Office is Open (Within 3 Days)  Patient/caregiver understands and will follow disposition?: Yes  Copied from CRM #[id number]. Topic: Clinical - Red Word Triage >> Dec 26, 2023 12:53 PM Rea ORN wrote: Red Word that prompted transfer to Nurse Triage: Left side pain in neck and shoulder. Reason for Disposition  [1] MODERATE neck pain (e.g., interferes with normal activities) AND [2] present > 3 days  Answer Assessment - Initial Assessment Questions 1. ONSET: When did the pain begin?      Three days ago 2. LOCATION: Where does it hurt?      Left neck and shoulder, difficulty turning head to the left 3. PATTERN Does the pain come and go, or has it been constant since it started?      Constant on Friday, Saturday, and Sunday, but has lessened today 4. SEVERITY: How bad is the pain?  (Scale 0-10; or none or slight stiffness, mild, moderate, severe)     2/10 5. RADIATION: Does the pain go anywhere else, shoot into your arms?     denies 6. CORD SYMPTOMS: Any weakness or numbness of the arms or legs?     denies 7. CAUSE: What do you think is causing the neck pain?     unknown 8. NECK OVERUSE: Any recent activities that involved turning or twisting the neck?     denies 9. OTHER SYMPTOMS: Do you have any other symptoms? (e.g., headache, fever, chest pain, difficulty breathing, neck swelling)     denies 10. PREGNANCY: Is there any chance you are pregnant? When was your last menstrual period?       N/a  Protocols used: Neck Pain or Stiffness-A-AH

## 2023-12-28 ENCOUNTER — Encounter: Payer: Self-pay | Admitting: Family Medicine

## 2023-12-28 ENCOUNTER — Ambulatory Visit: Admitting: Family Medicine

## 2023-12-28 VITALS — BP 100/64 | HR 68 | Temp 97.8°F | Ht 70.0 in | Wt 209.0 lb

## 2023-12-28 DIAGNOSIS — R5383 Other fatigue: Secondary | ICD-10-CM

## 2023-12-28 DIAGNOSIS — M542 Cervicalgia: Secondary | ICD-10-CM | POA: Diagnosis not present

## 2023-12-28 LAB — TSH: TSH: 1.23 u[IU]/mL (ref 0.35–5.50)

## 2023-12-28 LAB — CBC WITH DIFFERENTIAL/PLATELET
Basophils Absolute: 0 K/uL (ref 0.0–0.1)
Basophils Relative: 0.7 % (ref 0.0–3.0)
Eosinophils Absolute: 0.2 K/uL (ref 0.0–0.7)
Eosinophils Relative: 3.2 % (ref 0.0–5.0)
HCT: 41.8 % (ref 39.0–52.0)
Hemoglobin: 14.3 g/dL (ref 13.0–17.0)
Lymphocytes Relative: 35.2 % (ref 12.0–46.0)
Lymphs Abs: 2.4 K/uL (ref 0.7–4.0)
MCHC: 34.2 g/dL (ref 30.0–36.0)
MCV: 87.9 fl (ref 78.0–100.0)
Monocytes Absolute: 0.6 K/uL (ref 0.1–1.0)
Monocytes Relative: 9.3 % (ref 3.0–12.0)
Neutro Abs: 3.5 K/uL (ref 1.4–7.7)
Neutrophils Relative %: 51.6 % (ref 43.0–77.0)
Platelets: 187 K/uL (ref 150.0–400.0)
RBC: 4.76 Mil/uL (ref 4.22–5.81)
RDW: 13.4 % (ref 11.5–15.5)
WBC: 6.8 K/uL (ref 4.0–10.5)

## 2023-12-28 LAB — BASIC METABOLIC PANEL WITH GFR
BUN: 28 mg/dL — ABNORMAL HIGH (ref 6–23)
CO2: 27 meq/L (ref 19–32)
Calcium: 9.2 mg/dL (ref 8.4–10.5)
Chloride: 104 meq/L (ref 96–112)
Creatinine, Ser: 0.93 mg/dL (ref 0.40–1.50)
GFR: 76.34 mL/min (ref >60.00)
Glucose, Bld: 128 mg/dL — ABNORMAL HIGH (ref 70–99)
Potassium: 4.3 meq/L (ref 3.5–5.1)
Sodium: 142 meq/L (ref 135–145)

## 2023-12-28 LAB — HEPATIC FUNCTION PANEL
ALT: 15 U/L (ref 0–53)
AST: 19 U/L (ref 0–37)
Albumin: 4.3 g/dL (ref 3.5–5.2)
Alkaline Phosphatase: 51 U/L (ref 39–117)
Bilirubin, Direct: 0.1 mg/dL (ref 0.0–0.3)
Total Bilirubin: 0.5 mg/dL (ref 0.2–1.2)
Total Protein: 6.7 g/dL (ref 6.0–8.3)

## 2023-12-28 LAB — B12 AND FOLATE PANEL
Folate: 18.3 ng/mL (ref >5.9)
Vitamin B-12: 437 pg/mL (ref 211–911)

## 2023-12-28 NOTE — Patient Instructions (Addendum)
 Follow up in 2 weeks to recheck blood pressure We'll notify you of your lab results and make any changes if needed I'm so glad that your neck is feeling better!! Make sure you're drinking plenty of fluids Increase your salt intake which will increase your blood pressure Make sure you are getting the rest that you need (and golfing when you need that, too!) Call with any questions or concerns Stay Safe!  Stay Healthy! Happy Fall!!!

## 2023-12-28 NOTE — Progress Notes (Signed)
   Subjective:    Patient ID: Cory Barker, male    DOB: 09-07-1940, 83 y.o.   MRN: [id number]  HPI Neck pain- pt reports he was doing outdoor chores and then later that night (10/9) had difficulty sleeping due to pain, inability to turn head from side to side, L>R.  Today pt reports pain has improved and he is able to turn his head.  Took OTC NSAIDs w/ improvement in pain.    Fatigue- pt reports decreased energy recently.  Is still able to play golf but has decreased motivation to do so.  Pt reports that other than his recent neck pain, he has been sleeping well.     Review of Systems For ROS see HPI     Objective:   Physical Exam Vitals reviewed.  Constitutional:      General: He is not in acute distress.    Appearance: Normal appearance. He is well-developed. He is not ill-appearing.  HENT:     Head: Normocephalic and atraumatic.  Eyes:     Extraocular Movements: Extraocular movements intact.     Conjunctiva/sclera: Conjunctivae normal.     Pupils: Pupils are equal, round, and reactive to light.  Neck:     Thyroid : No thyromegaly.  Cardiovascular:     Rate and Rhythm: Normal rate and regular rhythm.     Pulses: Normal pulses.     Heart sounds: Normal heart sounds. No murmur heard. Pulmonary:     Effort: Pulmonary effort is normal. No respiratory distress.     Breath sounds: Normal breath sounds.  Abdominal:     General: Bowel sounds are normal. There is no distension.     Palpations: Abdomen is soft.  Musculoskeletal:     Cervical back: Normal range of motion and neck supple.     Right lower leg: No edema.     Left lower leg: No edema.  Lymphadenopathy:     Cervical: No cervical adenopathy.  Skin:    General: Skin is warm and dry.  Neurological:     General: No focal deficit present.     Mental Status: He is alert and oriented to person, place, and time.     Cranial Nerves: No cranial nerve deficit.  Psychiatric:        Mood and Affect: Mood normal.         Behavior: Behavior normal.           Assessment & Plan:  Neck pain- new.  This has self resolved.  Suspect it was due to muscle spasm/overuse from his outdoor chores.  Improved w/ OTC NSAIDs.  No further work up at this time.  Fatigue- new.  Likely multifactorial- age, possible mood as he is very discouraged by the current state of the country, and recent pain.  Will check labs to r/o possible underlying cause. Encouraged him to rest when needed, enjoy golf when possible, and let me know if things change or fail to improve.  Pt expressed understanding and is in agreement w/ plan.

## 2023-12-29 ENCOUNTER — Other Ambulatory Visit: Payer: Self-pay

## 2023-12-29 ENCOUNTER — Ambulatory Visit (INDEPENDENT_AMBULATORY_CARE_PROVIDER_SITE_OTHER)

## 2023-12-29 ENCOUNTER — Ambulatory Visit: Payer: Self-pay | Admitting: Family Medicine

## 2023-12-29 DIAGNOSIS — R739 Hyperglycemia, unspecified: Secondary | ICD-10-CM

## 2023-12-29 LAB — HEMOGLOBIN A1C: Hgb A1c MFr Bld: 5.7 % (ref 4.6–6.5)

## 2023-12-29 NOTE — Progress Notes (Signed)
 LVM to call or via MyChart

## 2023-12-29 NOTE — Progress Notes (Signed)
A1c lab add on faxed.

## 2023-12-29 NOTE — Progress Notes (Signed)
 Pt has been notified.

## 2023-12-30 NOTE — Progress Notes (Signed)
 Lab results have been discussed.   Verbalized understanding? Yes  Are there any questions? No

## 2024-01-04 ENCOUNTER — Other Ambulatory Visit: Payer: Self-pay | Admitting: Family Medicine

## 2024-01-04 DIAGNOSIS — E785 Hyperlipidemia, unspecified: Secondary | ICD-10-CM

## 2024-01-04 MED ORDER — SIMVASTATIN 10 MG PO TABS: 10.0000 mg | ORAL_TABLET | Freq: Every day | ORAL | 1 refills | Status: AC

## 2024-01-04 NOTE — Telephone Encounter (Signed)
 Copied from CRM [id number]. Topic: Clinical - Medication Refill >> Jan 04, 2024  8:29 AM Mercedes MATSU wrote: Medication:  simvastatin  (ZOCOR ) 10 MG tablet   Has the patient contacted their pharmacy? Yes (Agent: If no, request that the patient contact the pharmacy for the refill. If patient does not wish to contact the pharmacy document the reason why and proceed with request.) (Agent: If yes, when and what did the pharmacy advise?)  This is the patient's preferred pharmacy:  [id number] - [place], [place] - [address] [place] [address] Phone: [phone] Fax: [phone]  Is this the correct pharmacy for this prescription? Yes If no, delete pharmacy and type the correct one.   Has the prescription been filled recently? Yes  Is the patient out of the medication? Yes  Has the patient been seen for an appointment in the last year OR does the patient have an upcoming appointment? Yes  Can we respond through MyChart? Yes  Agent: Please be advised that Rx refills may take up to 3 business days. We ask that you follow-up with your pharmacy.

## 2024-01-11 ENCOUNTER — Ambulatory Visit (INDEPENDENT_AMBULATORY_CARE_PROVIDER_SITE_OTHER): Admitting: Family Medicine

## 2024-01-11 ENCOUNTER — Encounter: Payer: Self-pay | Admitting: Family Medicine

## 2024-01-11 VITALS — BP 134/78 | HR 78 | Temp 97.9°F | Ht 70.0 in | Wt 212.1 lb

## 2024-01-11 DIAGNOSIS — I959 Hypotension, unspecified: Secondary | ICD-10-CM

## 2024-01-11 NOTE — Patient Instructions (Addendum)
 Follow up in 6 months to recheck No need for labs today- yay!!! Continue to drink lots of water! Call with any questions or concerns Stay Safe!  Stay Healthy! SAFE TRAVELS!!!

## 2024-01-11 NOTE — Progress Notes (Signed)
   Subjective:    Patient ID: Cory Barker, male    DOB: 08-Mar-1941, 83 y.o.   MRN: [id number]  HPI Low blood pressure- at last visit BP was 100/64.  He was encouraged to increase salt and water intake.  BP today 134/78.  He still has some fatigue at times but this is an improvement from last visit.  Heading to [facility] tomorrow.  No CP, SOB, dizziness.   Review of Systems For ROS see HPI     Objective:   Physical Exam Vitals reviewed.  Constitutional:      General: He is not in acute distress.    Appearance: Normal appearance. He is well-developed. He is not ill-appearing.  HENT:     Head: Normocephalic and atraumatic.  Eyes:     Extraocular Movements: Extraocular movements intact.     Conjunctiva/sclera: Conjunctivae normal.     Pupils: Pupils are equal, round, and reactive to light.  Neck:     Thyroid : No thyromegaly.  Cardiovascular:     Rate and Rhythm: Normal rate and regular rhythm.     Pulses: Normal pulses.     Heart sounds: Murmur: I-II/VI SEM.  Pulmonary:     Effort: Pulmonary effort is normal. No respiratory distress.     Breath sounds: Normal breath sounds.  Abdominal:     General: Bowel sounds are normal. There is no distension.     Palpations: Abdomen is soft.  Musculoskeletal:     Cervical back: Normal range of motion and neck supple.     Right lower leg: No edema.     Left lower leg: No edema.  Lymphadenopathy:     Cervical: No cervical adenopathy.  Skin:    General: Skin is warm and dry.  Neurological:     General: No focal deficit present.     Mental Status: He is alert and oriented to person, place, and time.     Cranial Nerves: No cranial nerve deficit.  Psychiatric:        Mood and Affect: Mood normal.        Behavior: Behavior normal.           Assessment & Plan:  Hypotension- resolved.  BP today is excellent.  Pt is feeling better overall but still has episodes of fatigue.  Suspect this is age related.  Encouraged increased fluids.  Pt  expressed understanding and is in agreement w/ plan.

## 2024-07-09 ENCOUNTER — Ambulatory Visit: Admitting: Family Medicine

## 2024-07-10 ENCOUNTER — Ambulatory Visit: Admitting: Family Medicine

## 2024-07-17 ENCOUNTER — Ambulatory Visit
